# Patient Record
Sex: Female | Born: 1945 | Race: White | Hispanic: No | Marital: Married | Smoking: Former smoker
Health system: Southern US, Community
[De-identification: ages and names within clinical notes are randomized; demographics above are authoritative.]

## PROBLEM LIST (undated history)

## (undated) DIAGNOSIS — I2510 Atherosclerotic heart disease of native coronary artery without angina pectoris: Secondary | ICD-10-CM

## (undated) DIAGNOSIS — R251 Tremor, unspecified: Secondary | ICD-10-CM

## (undated) DIAGNOSIS — Z9889 Other specified postprocedural states: Secondary | ICD-10-CM

## (undated) DIAGNOSIS — I679 Cerebrovascular disease, unspecified: Secondary | ICD-10-CM

## (undated) DIAGNOSIS — J449 Chronic obstructive pulmonary disease, unspecified: Secondary | ICD-10-CM

## (undated) DIAGNOSIS — G8929 Other chronic pain: Secondary | ICD-10-CM

## (undated) DIAGNOSIS — I272 Pulmonary hypertension, unspecified: Secondary | ICD-10-CM

## (undated) DIAGNOSIS — Z87448 Personal history of other diseases of urinary system: Secondary | ICD-10-CM

## (undated) DIAGNOSIS — G5600 Carpal tunnel syndrome, unspecified upper limb: Secondary | ICD-10-CM

## (undated) DIAGNOSIS — R011 Cardiac murmur, unspecified: Secondary | ICD-10-CM

## (undated) DIAGNOSIS — R42 Dizziness and giddiness: Secondary | ICD-10-CM

## (undated) DIAGNOSIS — D649 Anemia, unspecified: Secondary | ICD-10-CM

## (undated) DIAGNOSIS — R0600 Dyspnea, unspecified: Secondary | ICD-10-CM

## (undated) DIAGNOSIS — F32A Depression, unspecified: Secondary | ICD-10-CM

## (undated) DIAGNOSIS — K219 Gastro-esophageal reflux disease without esophagitis: Secondary | ICD-10-CM

## (undated) DIAGNOSIS — I10 Essential (primary) hypertension: Secondary | ICD-10-CM

## (undated) DIAGNOSIS — J189 Pneumonia, unspecified organism: Secondary | ICD-10-CM

## (undated) DIAGNOSIS — E785 Hyperlipidemia, unspecified: Secondary | ICD-10-CM

## (undated) DIAGNOSIS — Z8679 Personal history of other diseases of the circulatory system: Secondary | ICD-10-CM

## (undated) DIAGNOSIS — M1990 Unspecified osteoarthritis, unspecified site: Secondary | ICD-10-CM

## (undated) DIAGNOSIS — M810 Age-related osteoporosis without current pathological fracture: Secondary | ICD-10-CM

## (undated) DIAGNOSIS — M549 Dorsalgia, unspecified: Secondary | ICD-10-CM

## (undated) DIAGNOSIS — F329 Major depressive disorder, single episode, unspecified: Secondary | ICD-10-CM

## (undated) DIAGNOSIS — H269 Unspecified cataract: Secondary | ICD-10-CM

## (undated) DIAGNOSIS — R112 Nausea with vomiting, unspecified: Secondary | ICD-10-CM

## (undated) DIAGNOSIS — G450 Vertebro-basilar artery syndrome: Secondary | ICD-10-CM

## (undated) DIAGNOSIS — R7303 Prediabetes: Secondary | ICD-10-CM

## (undated) HISTORY — PX: OTHER SURGICAL HISTORY: SHX169

## (undated) HISTORY — PX: WISDOM TOOTH EXTRACTION: SHX21

## (undated) HISTORY — PX: ABDOMINAL HYSTERECTOMY: SHX81

## (undated) HISTORY — DX: Tremor, unspecified: R25.1

## (undated) HISTORY — PX: TONSILLECTOMY: SUR1361

## (undated) HISTORY — PX: BLEPHAROPLASTY: SUR158

## (undated) HISTORY — DX: Other chronic pain: G89.29

## (undated) HISTORY — PX: CARDIAC CATHETERIZATION: SHX172

---

## 1999-02-18 ENCOUNTER — Ambulatory Visit (HOSPITAL_COMMUNITY): Admission: RE | Admit: 1999-02-18 | Discharge: 1999-02-18 | Payer: Self-pay | Admitting: Neurological Surgery

## 1999-02-18 ENCOUNTER — Encounter: Payer: Self-pay | Admitting: Neurological Surgery

## 1999-03-04 ENCOUNTER — Ambulatory Visit (HOSPITAL_COMMUNITY): Admission: RE | Admit: 1999-03-04 | Discharge: 1999-03-04 | Payer: Self-pay | Admitting: Neurological Surgery

## 1999-03-04 ENCOUNTER — Encounter: Payer: Self-pay | Admitting: Neurological Surgery

## 1999-03-26 ENCOUNTER — Encounter: Payer: Self-pay | Admitting: Neurological Surgery

## 1999-03-26 ENCOUNTER — Ambulatory Visit (HOSPITAL_COMMUNITY): Admission: RE | Admit: 1999-03-26 | Discharge: 1999-03-26 | Payer: Self-pay | Admitting: Neurological Surgery

## 1999-12-22 ENCOUNTER — Ambulatory Visit (HOSPITAL_COMMUNITY): Admission: RE | Admit: 1999-12-22 | Discharge: 1999-12-22 | Payer: Self-pay | Admitting: Neurological Surgery

## 1999-12-22 ENCOUNTER — Encounter: Payer: Self-pay | Admitting: Neurological Surgery

## 2000-01-24 ENCOUNTER — Encounter: Payer: Self-pay | Admitting: Neurological Surgery

## 2000-01-24 ENCOUNTER — Ambulatory Visit (HOSPITAL_COMMUNITY): Admission: RE | Admit: 2000-01-24 | Discharge: 2000-01-24 | Payer: Self-pay | Admitting: Neurological Surgery

## 2001-10-07 ENCOUNTER — Encounter: Admission: RE | Admit: 2001-10-07 | Discharge: 2001-10-07 | Payer: Self-pay | Admitting: Neurological Surgery

## 2001-10-07 ENCOUNTER — Encounter: Payer: Self-pay | Admitting: Neurological Surgery

## 2002-05-09 ENCOUNTER — Encounter: Payer: Self-pay | Admitting: Neurological Surgery

## 2002-05-09 ENCOUNTER — Encounter: Admission: RE | Admit: 2002-05-09 | Discharge: 2002-05-09 | Payer: Self-pay | Admitting: Neurological Surgery

## 2005-10-19 ENCOUNTER — Ambulatory Visit: Payer: Self-pay | Admitting: Internal Medicine

## 2005-11-03 ENCOUNTER — Ambulatory Visit: Payer: Self-pay | Admitting: Internal Medicine

## 2005-11-03 ENCOUNTER — Encounter (INDEPENDENT_AMBULATORY_CARE_PROVIDER_SITE_OTHER): Payer: Self-pay | Admitting: *Deleted

## 2005-11-06 ENCOUNTER — Ambulatory Visit: Payer: Self-pay | Admitting: Gastroenterology

## 2005-12-04 ENCOUNTER — Ambulatory Visit: Payer: Self-pay | Admitting: Gastroenterology

## 2006-01-01 ENCOUNTER — Ambulatory Visit: Payer: Self-pay | Admitting: Gastroenterology

## 2006-02-01 ENCOUNTER — Ambulatory Visit: Payer: Self-pay | Admitting: Gastroenterology

## 2006-07-16 ENCOUNTER — Ambulatory Visit: Payer: Self-pay | Admitting: Gastroenterology

## 2006-07-30 ENCOUNTER — Ambulatory Visit: Payer: Self-pay | Admitting: Gastroenterology

## 2006-08-30 ENCOUNTER — Ambulatory Visit: Payer: Self-pay | Admitting: Gastroenterology

## 2006-09-24 ENCOUNTER — Ambulatory Visit: Payer: Self-pay | Admitting: Gastroenterology

## 2006-10-22 ENCOUNTER — Ambulatory Visit: Payer: Self-pay | Admitting: Internal Medicine

## 2007-01-20 ENCOUNTER — Ambulatory Visit: Payer: Self-pay | Admitting: Gastroenterology

## 2007-04-22 ENCOUNTER — Ambulatory Visit: Payer: Self-pay | Admitting: Gastroenterology

## 2007-07-22 ENCOUNTER — Ambulatory Visit: Payer: Self-pay | Admitting: Gastroenterology

## 2007-10-27 ENCOUNTER — Ambulatory Visit: Payer: Self-pay | Admitting: Gastroenterology

## 2007-12-02 ENCOUNTER — Ambulatory Visit: Payer: Self-pay | Admitting: Gastroenterology

## 2007-12-09 ENCOUNTER — Ambulatory Visit: Payer: Self-pay | Admitting: Internal Medicine

## 2007-12-09 HISTORY — PX: COLONOSCOPY: SHX174

## 2007-12-16 ENCOUNTER — Ambulatory Visit: Payer: Self-pay | Admitting: Gastroenterology

## 2007-12-23 ENCOUNTER — Ambulatory Visit: Payer: Self-pay | Admitting: Gastroenterology

## 2007-12-29 ENCOUNTER — Ambulatory Visit: Payer: Self-pay | Admitting: Gastroenterology

## 2008-01-06 ENCOUNTER — Ambulatory Visit: Payer: Self-pay | Admitting: Gastroenterology

## 2008-01-19 ENCOUNTER — Ambulatory Visit: Payer: Self-pay | Admitting: Gastroenterology

## 2008-03-14 ENCOUNTER — Ambulatory Visit: Payer: Self-pay | Admitting: Gastroenterology

## 2009-04-07 ENCOUNTER — Emergency Department (HOSPITAL_BASED_OUTPATIENT_CLINIC_OR_DEPARTMENT_OTHER): Admission: EM | Admit: 2009-04-07 | Discharge: 2009-04-08 | Payer: Self-pay | Admitting: Emergency Medicine

## 2009-04-07 ENCOUNTER — Ambulatory Visit: Payer: Self-pay | Admitting: Diagnostic Radiology

## 2010-12-28 LAB — CBC
Hemoglobin: 12.6 g/dL (ref 12.0–15.0)
MCHC: 35 g/dL (ref 30.0–36.0)
RBC: 4.12 MIL/uL (ref 3.87–5.11)
WBC: 12.9 10*3/uL — ABNORMAL HIGH (ref 4.0–10.5)

## 2010-12-28 LAB — DIFFERENTIAL
Basophils Relative: 1 % (ref 0–1)
Eosinophils Absolute: 0 10*3/uL (ref 0.0–0.7)
Eosinophils Relative: 0 % (ref 0–5)
Lymphs Abs: 0.4 10*3/uL — ABNORMAL LOW (ref 0.7–4.0)
Monocytes Relative: 4 % (ref 3–12)

## 2010-12-28 LAB — COMPREHENSIVE METABOLIC PANEL
ALT: 18 U/L (ref 0–35)
AST: 33 U/L (ref 0–37)
Alkaline Phosphatase: 69 U/L (ref 39–117)
CO2: 25 mEq/L (ref 19–32)
Calcium: 9.1 mg/dL (ref 8.4–10.5)
GFR calc Af Amer: >60 mL/min (ref >60)
GFR calc non Af Amer: >60 mL/min (ref >60)
Glucose, Bld: 124 mg/dL — ABNORMAL HIGH (ref 70–99)
Potassium: 3.2 mEq/L — ABNORMAL LOW (ref 3.5–5.1)
Sodium: 138 mEq/L (ref 135–145)

## 2010-12-28 LAB — CULTURE, BLOOD (ROUTINE X 2)
Culture: NO GROWTH
Culture: NO GROWTH

## 2010-12-28 LAB — URINALYSIS, ROUTINE W REFLEX MICROSCOPIC
Glucose, UA: NEGATIVE mg/dL
Hgb urine dipstick: NEGATIVE
Ketones, ur: NEGATIVE mg/dL
Protein, ur: NEGATIVE mg/dL
Urobilinogen, UA: 0.2 mg/dL (ref 0.0–1.0)

## 2011-07-22 ENCOUNTER — Other Ambulatory Visit: Payer: Self-pay | Admitting: Otolaryngology

## 2011-07-22 DIAGNOSIS — R42 Dizziness and giddiness: Secondary | ICD-10-CM

## 2011-07-28 ENCOUNTER — Ambulatory Visit
Admission: RE | Admit: 2011-07-28 | Discharge: 2011-07-28 | Disposition: A | Discharge status: Home / Self Care | Payer: Medicare Other | Source: Ambulatory Visit | Attending: Otolaryngology | Admitting: Otolaryngology

## 2011-07-28 DIAGNOSIS — R42 Dizziness and giddiness: Secondary | ICD-10-CM

## 2011-07-28 MED ORDER — GADOBENATE DIMEGLUMINE 529 MG/ML IV SOLN
12.0000 mL | Freq: Once | INTRAVENOUS | Status: AC | PRN
  Administered 2011-07-28: 12 mL via INTRAVENOUS

## 2013-10-19 HISTORY — PX: AORTIC VALVE REPLACEMENT (AVR)/CORONARY ARTERY BYPASS GRAFTING (CABG): SHX5725

## 2014-09-27 ENCOUNTER — Encounter: Payer: Self-pay | Admitting: Gastroenterology

## 2014-09-27 ENCOUNTER — Encounter: Payer: Self-pay | Admitting: Internal Medicine

## 2015-03-18 HISTORY — PX: BACK SURGERY: SHX140

## 2015-11-26 ENCOUNTER — Encounter: Payer: Self-pay | Admitting: Internal Medicine

## 2016-06-25 HISTORY — PX: SACROSPINOUS LIGAMENT FIXATION: SHX2371

## 2017-05-16 ENCOUNTER — Emergency Department (HOSPITAL_BASED_OUTPATIENT_CLINIC_OR_DEPARTMENT_OTHER)
Admission: EM | Admit: 2017-05-16 | Discharge: 2017-05-16 | Disposition: A | Discharge status: Home / Self Care | Payer: Medicare Other | Attending: Emergency Medicine | Admitting: Emergency Medicine

## 2017-05-16 DIAGNOSIS — R112 Nausea with vomiting, unspecified: Secondary | ICD-10-CM | POA: Diagnosis not present

## 2017-05-16 DIAGNOSIS — R531 Weakness: Secondary | ICD-10-CM | POA: Insufficient documentation

## 2017-05-16 LAB — CBC WITH DIFFERENTIAL/PLATELET
BASOS ABS: 0 10*3/uL (ref 0.0–0.1)
Basophils Relative: 0 %
EOS ABS: 0.1 10*3/uL (ref 0.0–0.7)
EOS PCT: 1 %
HEMATOCRIT: 34.3 % — AB (ref 36.0–46.0)
Hemoglobin: 11.1 g/dL — ABNORMAL LOW (ref 12.0–15.0)
LYMPHS PCT: 8 %
Lymphs Abs: 0.8 10*3/uL (ref 0.7–4.0)
MCH: 29.4 pg (ref 26.0–34.0)
MCHC: 32.4 g/dL (ref 30.0–36.0)
MCV: 90.7 fL (ref 78.0–100.0)
Monocytes Absolute: 0.6 10*3/uL (ref 0.1–1.0)
Monocytes Relative: 6 %
NEUTROS ABS: 7.8 10*3/uL — AB (ref 1.7–7.7)
NEUTROS PCT: 85 %
Platelets: 461 10*3/uL — ABNORMAL HIGH (ref 150–400)
RBC: 3.78 MIL/uL — ABNORMAL LOW (ref 3.87–5.11)
RDW: 13.5 % (ref 11.5–15.5)
WBC: 9.2 10*3/uL (ref 4.0–10.5)

## 2017-05-16 LAB — COMPREHENSIVE METABOLIC PANEL
ALT: 18 U/L (ref 14–54)
ANION GAP: 14 (ref 5–15)
AST: 22 U/L (ref 15–41)
Albumin: 4 g/dL (ref 3.5–5.0)
Alkaline Phosphatase: 93 U/L (ref 38–126)
BILIRUBIN TOTAL: 0.9 mg/dL (ref 0.3–1.2)
BUN: 15 mg/dL (ref 6–20)
CHLORIDE: 101 mmol/L (ref 101–111)
CO2: 24 mmol/L (ref 22–32)
Calcium: 9.7 mg/dL (ref 8.9–10.3)
Creatinine, Ser: 0.75 mg/dL (ref 0.44–1.00)
Glucose, Bld: 117 mg/dL — ABNORMAL HIGH (ref 65–99)
POTASSIUM: 3.5 mmol/L (ref 3.5–5.1)
Sodium: 139 mmol/L (ref 135–145)
TOTAL PROTEIN: 7.7 g/dL (ref 6.5–8.1)

## 2017-05-16 LAB — URINALYSIS, ROUTINE W REFLEX MICROSCOPIC
Bilirubin Urine: NEGATIVE
Glucose, UA: NEGATIVE mg/dL
Hgb urine dipstick: NEGATIVE
Ketones, ur: NEGATIVE mg/dL
Leukocytes, UA: NEGATIVE
NITRITE: NEGATIVE
PH: 7.5 (ref 5.0–8.0)
Protein, ur: NEGATIVE mg/dL
SPECIFIC GRAVITY, URINE: 1.008 (ref 1.005–1.030)

## 2017-05-16 LAB — I-STAT CG4 LACTIC ACID, ED: Lactic Acid, Venous: 1.93 mmol/L — ABNORMAL HIGH (ref 0.5–1.9)

## 2017-05-16 LAB — CBG MONITORING, ED: GLUCOSE-CAPILLARY: 101 mg/dL — AB (ref 65–99)

## 2017-05-16 LAB — TROPONIN I: Troponin I: <0.03 ng/mL (ref <0.03)

## 2017-05-16 LAB — LIPASE, BLOOD: LIPASE: 24 U/L (ref 11–51)

## 2017-05-16 MED ORDER — METOCLOPRAMIDE HCL 5 MG/ML IJ SOLN
10.0000 mg | INTRAMUSCULAR | Status: AC
  Administered 2017-05-16: 10 mg via INTRAVENOUS
  Filled 2017-05-16: qty 2

## 2017-05-16 MED ORDER — FENTANYL CITRATE (PF) 100 MCG/2ML IJ SOLN
50.0000 ug | Freq: Once | INTRAMUSCULAR | Status: AC
  Administered 2017-05-16: 50 ug via INTRAVENOUS
  Filled 2017-05-16: qty 2

## 2017-05-16 MED ORDER — ONDANSETRON 4 MG PO TBDP: 4.0000 mg | ORAL_TABLET | Freq: Three times a day (TID) | ORAL | 0 refills | Status: DC | PRN

## 2017-05-16 MED ORDER — SODIUM CHLORIDE 0.9 % IV BOLUS (SEPSIS)
1000.0000 mL | Freq: Once | INTRAVENOUS | Status: AC
  Administered 2017-05-16: 1000 mL via INTRAVENOUS

## 2017-05-16 NOTE — ED Notes (Signed)
Pt states that she is feeling better.  Repositioned for comfort.  Family remains at the bedside

## 2017-05-16 NOTE — ED Notes (Signed)
Pt on cardiac monitor and auto VS 

## 2017-05-16 NOTE — ED Notes (Signed)
Gave pt coke and saltine crackers.  Pt sat up at the edge of the bed and was able to tolerate the coke and crackers.  No acute distress at this time.  Will update EDP

## 2017-05-16 NOTE — ED Notes (Signed)
ED Provider at bedside. 

## 2017-05-16 NOTE — ED Triage Notes (Signed)
N/v x 2 days, recent back surgery x 2 weeks ago. Lethargic

## 2017-05-16 NOTE — ED Notes (Signed)
Pt notified that urine is needed. Pt reports that she voided before coming to the ED and states that she can not void.  Will check back with her shortly after a bit more IV fluids have infused.

## 2017-05-16 NOTE — ED Provider Notes (Signed)
[facility]   CSN: [id number] Arrival date & time: 05/16/17  1529     History   Chief Complaint Chief Complaint  Patient presents with  . Weakness    HPI Karen Woods is a 71 y.o. female.  71yo F w/ extensive PMH including COPD, HTN, HLD, T2DM, aortic valve repair, recent spine surgery who p/w nausea, vomiting, and weakness. The patient is 2 weeks status post spine surgery at [facility]. She has been recovering at home. Daughter states that 2 days ago she was weaned off of OxyContin. Around 2 days ago she began having nausea and vomiting which has persisted despite trying Zofran. She has had decreased oral intake. She has had sweats but no fevers. She had a normal bowel movement today, no blood and no diarrhea. She complains of some mild left side pain but denies any severe abdominal pain currently. Currently, her back pain is 9/10 in intensity. She denies any chest pain, SOB, urinary sx or sick contacts. She has healed well and no drainage or redness at incision site. She has felt lightheaded at home but no syncopal episodes. When she got to the ER she had a near syncopal episode while sitting in the wheelchair coming back.   The history is provided by the patient and a relative.  Weakness     No past medical history on file.  There are no active problems to display for this patient.   No past surgical history on file.  OB History    No data available       Home Medications    Prior to Admission medications   Medication Sig Start Date End Date Taking? Authorizing Provider  ondansetron (ZOFRAN ODT) 4 MG disintegrating tablet Take 1 tablet (4 mg total) by mouth every 8 (eight) hours as needed for nausea or vomiting. 05/16/17   Jerret Mcbane, Ambrose Finland, MD    Family History No family history on file.  Social History Social History  Substance Use Topics  . Smoking status: Not on file  . Smokeless tobacco: Not on file  . Alcohol use  Not on file     Allergies   Codeine   Review of Systems Review of Systems  Neurological: Positive for weakness.   All other systems reviewed and are negative except that which was mentioned in HPI   Physical Exam Updated Vital Signs BP (!) 146/75   Pulse (!) 52   Temp (!) 97.5 F (36.4 C) (Oral)   Resp 12   Ht 5\' 4"  (1.626 m)   Wt 68 kg (150 lb)   SpO2 99%   BMI 25.75 kg/m   Physical Exam  Constitutional: She is oriented to person, place, and time. She appears well-developed and well-nourished. She appears distressed.  Sleepy, pale, weak  HENT:  Head: Normocephalic and atraumatic.  dry mucous membranes  Eyes: Pupils are equal, round, and reactive to light. Conjunctivae are normal.  Neck: Neck supple.  Cardiovascular: Normal rate, regular rhythm and normal heart sounds.   No murmur heard. Pulmonary/Chest: Effort normal and breath sounds normal.  Abdominal: Soft. Bowel sounds are normal. She exhibits no distension. There is no tenderness.  Musculoskeletal: She exhibits no edema.  Neurological: She is alert and oriented to person, place, and time.  Fluent speech, moving all 4 extremities equally  Skin: Skin is warm and dry. There is pallor.  Large surgical incision along length of thoracolumbar spine to top of gluteal crease, clean/dry/intact without surrounding redness  Psychiatric:  In mild distress  Nursing note and vitals reviewed.    ED Treatments / Results  Labs (all labs ordered are listed, but only abnormal results are displayed) Labs Reviewed  COMPREHENSIVE METABOLIC PANEL - Abnormal; Notable for the following:       Result Value   Glucose, Bld 117 (*)    All other components within normal limits  CBC WITH DIFFERENTIAL/PLATELET - Abnormal; Notable for the following:    RBC 3.78 (*)    Hemoglobin 11.1 (*)    HCT 34.3 (*)    Platelets 461 (*)    Neutro Abs 7.8 (*)    All other components within normal limits  URINALYSIS, ROUTINE W REFLEX  MICROSCOPIC - Abnormal; Notable for the following:    APPearance CLOUDY (*)    All other components within normal limits  CBG MONITORING, ED - Abnormal; Notable for the following:    Glucose-Capillary 101 (*)    All other components within normal limits  I-STAT CG4 LACTIC ACID, ED - Abnormal; Notable for the following:    Lactic Acid, Venous 1.93 (*)    All other components within normal limits  LIPASE, BLOOD  TROPONIN I    EKG  EKG Interpretation  Date/Time:  Sunday May 16 2017 15:39:59 EDT Ventricular Rate:  62 PR Interval:    QRS Duration: 98 QT Interval:  443 QTC Calculation: 450 R Axis:   79 Text Interpretation:  Sinus rhythm Ventricular premature complex Borderline ST depression, diffuse leads Baseline wander in lead(s) II III aVR aVL aVF V1 V2 V3 V4 V5 V6 No previous ECGs available Confirmed by Frederick Peers [phone]) on 05/16/2017 3:57:42 PM Also confirmed by Frederick Peers [phone]), editor Madalyn Rob [phone])  on 05/16/2017 4:07:41 PM       Radiology No results found.  Procedures Procedures (including critical care time)  Medications Ordered in ED Medications  sodium chloride 0.9 % bolus 1,000 mL (0 mLs Intravenous Stopped 05/16/17 1730)  metoCLOPramide (REGLAN) injection 10 mg (10 mg Intravenous Given 05/16/17 1610)  fentaNYL (SUBLIMAZE) injection 50 mcg (50 mcg Intravenous Given 05/16/17 1729)     Initial Impression / Assessment and Plan / ED Course  I have reviewed the triage vital signs and the nursing notes.  Pertinent labs & imaging results that were available during my care of the patient were reviewed by me and considered in my medical decision making (see chart for details).     PT w/ 2 days of N/V, 2 weeks s/p spine surgery. She was immediately Brought back from triage when she had a near syncopal episode in the wheelchair. She was pale and in mild distress but able to answer questions, vital signs stable. Afebrile. No abdominal tenderness.  Incision site appears to be healing well with no evidence of infection. EKG without acute ischemia or arrhythmia. Gave IV fluids, Reglan.   Obtained above labs, initial lactate 1.93, Wbc normal, Hgb 11.1 which is similar to previous and appropriate after her surgery, normal CMP and lipase, no evidence of UTI, normal Cr. Trop negative and pt has denied any epigastric pain, CP, breathing problems or other complaints that suggest cardiac etiology. No abdominal pain and no tenderness on exam therefore I do not feel she needs abdominal imaging. No headache or neuro sx to suggest intracranial cause of vomiting. She felt much better on re-examination, was sitting up and well appearing. She has been able to walk a short distance in ED. She wants to go home. I have discussed  supportive measures, given zofran to use as needed, and extensively reviewed return precautions with her and her family. Pt discharged in satisfactory condition. Final Clinical Impressions(s) / ED Diagnoses   Final diagnoses:  Weakness  Non-intractable vomiting with nausea, unspecified vomiting type    New Prescriptions Discharge Medication List as of 05/16/2017  7:18 PM    START taking these medications   Details  ondansetron (ZOFRAN ODT) 4 MG disintegrating tablet Take 1 tablet (4 mg total) by mouth every 8 (eight) hours as needed for nausea or vomiting., Starting Sun 05/16/2017, Print         Ovila Lepage, Ambrose Finland, MD 05/17/17 [phone]

## 2018-01-06 ENCOUNTER — Encounter: Payer: Self-pay | Admitting: Internal Medicine

## 2018-03-09 ENCOUNTER — Other Ambulatory Visit: Payer: Self-pay

## 2018-03-09 ENCOUNTER — Emergency Department (HOSPITAL_BASED_OUTPATIENT_CLINIC_OR_DEPARTMENT_OTHER)
Admission: EM | Admit: 2018-03-09 | Discharge: 2018-03-09 | Disposition: A | Discharge status: Home / Self Care | Payer: Medicare Other | Attending: Emergency Medicine | Admitting: Emergency Medicine

## 2018-03-09 ENCOUNTER — Encounter (HOSPITAL_BASED_OUTPATIENT_CLINIC_OR_DEPARTMENT_OTHER): Payer: Self-pay | Admitting: *Deleted

## 2018-03-09 DIAGNOSIS — Z5321 Procedure and treatment not carried out due to patient leaving prior to being seen by health care provider: Secondary | ICD-10-CM | POA: Insufficient documentation

## 2018-03-09 DIAGNOSIS — R21 Rash and other nonspecific skin eruption: Secondary | ICD-10-CM | POA: Insufficient documentation

## 2018-03-09 HISTORY — DX: Atherosclerotic heart disease of native coronary artery without angina pectoris: I25.10

## 2018-03-09 NOTE — ED Notes (Signed)
Pt states her rash has gone , states she is going home

## 2018-03-09 NOTE — ED Triage Notes (Addendum)
Pt c/o rash to chest and arms x 1 day PTA benadryl and zyrtec

## 2018-06-15 ENCOUNTER — Other Ambulatory Visit: Payer: Self-pay | Admitting: Rehabilitation

## 2018-06-15 ENCOUNTER — Inpatient Hospital Stay
Admission: RE | Admit: 2018-06-15 | Discharge: 2018-06-15 | Disposition: A | Discharge status: Home / Self Care | Payer: Medicare Other | Source: Ambulatory Visit | Attending: Rehabilitation | Admitting: Rehabilitation

## 2018-06-15 DIAGNOSIS — M5104 Intervertebral disc disorders with myelopathy, thoracic region: Secondary | ICD-10-CM

## 2018-09-05 NOTE — Progress Notes (Signed)
Please place orders in Epic as patient is being scheduled for a pre-op appointment! Thank you! 

## 2018-09-27 ENCOUNTER — Encounter (HOSPITAL_COMMUNITY): Payer: Self-pay

## 2018-09-27 NOTE — Patient Instructions (Addendum)
Your procedure is scheduled on: Monday, Jan. 13, 2020   Surgery Time: 10:35AM-12:15PM   Report to [facility] Main  Entrance    Report to admitting at 8:05 AM   Call this number if you have problems the morning of surgery [phone]   Do not eat food or drink liquids :After Midnight.   Brush your teeth the morning of surgery.   Do NOT smoke after Midnight   Take these medicines the morning of surgery with A SIP OF WATER: Gabapentin, Metoprolol, Omeprazole, Rosuvastatin   Bring Asthma Inhaler day of surgery                               You may not have any metal on your body including hair pins, jewelry, and body piercings             Do not wear make-up, lotions, powders, perfumes/cologne, or deodorant             Do not wear nail polish.  Do not shave  48 hours prior to surgery.             Do not bring valuables to the hospital. [place] [facility] IS NOT             RESPONSIBLE   FOR VALUABLES.   Contacts, dentures or bridgework may not be worn into surgery.   Leave suitcase in the car. After surgery it may be brought to your room.    Special Instructions: Bring a copy of your healthcare power of attorney and living will documents         the day of surgery if you haven't scanned them in before.              Please read over the following fact sheets you were given:  [facility] - Preparing for Surgery Before surgery, you can play an important role.  Because skin is not sterile, your skin needs to be as free of germs as possible.  You can reduce the number of germs on your skin by washing with CHG (chlorahexidine gluconate) soap before surgery.  CHG is an antiseptic cleaner which kills germs and bonds with the skin to continue killing germs even after washing. Please DO NOT use if you have an allergy to CHG or antibacterial soaps.  If your skin becomes reddened/irritated stop using the CHG and inform your nurse when you arrive at Short Stay. Do not shave (including  legs and underarms) for at least 48 hours prior to the first CHG shower.  You may shave your face/neck.  Please follow these instructions carefully:  1.  Shower with CHG Soap the night before surgery and the  morning of surgery.  2.  If you choose to wash your hair, wash your hair first as usual with your normal  shampoo.  3.  After you shampoo, rinse your hair and body thoroughly to remove the shampoo.                             4.  Use CHG as you would any other liquid soap.  You can apply chg directly to the skin and wash.  Gently with a scrungie or clean washcloth.  5.  Apply the CHG Soap to your body ONLY FROM THE NECK DOWN.   Do   not use on face/ open  Wound or open sores. Avoid contact with eyes, ears mouth and   genitals (private parts).                       Wash face,  Genitals (private parts) with your normal soap.             6.  Wash thoroughly, paying special attention to the area where your    surgery  will be performed.  7.  Thoroughly rinse your body with warm water from the neck down.  8.  DO NOT shower/wash with your normal soap after using and rinsing off the CHG Soap.                9.  Pat yourself dry with a clean towel.            10.  Wear clean pajamas.            11.  Place clean sheets on your bed the night of your first shower and do not  sleep with pets. Day of Surgery : Do not apply any lotions/deodorants the morning of surgery.  Please wear clean clothes to the hospital/surgery center.  FAILURE TO FOLLOW THESE INSTRUCTIONS MAY RESULT IN THE CANCELLATION OF YOUR SURGERY  PATIENT SIGNATURE_________________________________  NURSE SIGNATURE__________________________________  ________________________________________________________________________   Karen Woods  An incentive spirometer is a tool that can help keep your lungs clear and active. This tool measures how well you are filling your lungs with each breath. Taking  long deep breaths may help reverse or decrease the chance of developing breathing (pulmonary) problems (especially infection) following:  A long period of time when you are unable to move or be active. BEFORE THE PROCEDURE   If the spirometer includes an indicator to show your best effort, your nurse or respiratory therapist will set it to a desired goal.  If possible, sit up straight or lean slightly forward. Try not to slouch.  Hold the incentive spirometer in an upright position. INSTRUCTIONS FOR USE  1. Sit on the edge of your bed if possible, or sit up as far as you can in bed or on a chair. 2. Hold the incentive spirometer in an upright position. 3. Breathe out normally. 4. Place the mouthpiece in your mouth and seal your lips tightly around it. 5. Breathe in slowly and as deeply as possible, raising the piston or the ball toward the top of the column. 6. Hold your breath for 3-5 seconds or for as long as possible. Allow the piston or ball to fall to the bottom of the column. 7. Remove the mouthpiece from your mouth and breathe out normally. 8. Rest for a few seconds and repeat Steps 1 through 7 at least 10 times every 1-2 hours when you are awake. Take your time and take a few normal breaths between deep breaths. 9. The spirometer may include an indicator to show your best effort. Use the indicator as a goal to work toward during each repetition. 10. After each set of 10 deep breaths, practice coughing to be sure your lungs are clear. If you have an incision (the cut made at the time of surgery), support your incision when coughing by placing a pillow or rolled up towels firmly against it. Once you are able to get out of bed, walk around indoors and cough well. You may stop using the incentive spirometer when instructed by your caregiver.  RISKS AND COMPLICATIONS  Take your time  so you do not get dizzy or light-headed.  If you are in pain, you may need to take or ask for pain  medication before doing incentive spirometry. It is harder to take a deep breath if you are having pain. AFTER USE  Rest and breathe slowly and easily.  It can be helpful to keep track of a log of your progress. Your caregiver can provide you with a simple table to help with this. If you are using the spirometer at home, follow these instructions: Karen Woods IF:   You are having difficultly using the spirometer.  You have trouble using the spirometer as often as instructed.  Your pain medication is not giving enough relief while using the spirometer.  You develop fever of 100.5 F (38.1 C) or higher. SEEK IMMEDIATE MEDICAL CARE IF:   You cough up bloody sputum that had not been present before.  You develop fever of 102 F (38.9 C) or greater.  You develop worsening pain at or near the incision site. MAKE SURE YOU:   Understand these instructions.  Will watch your condition.  Will get help right away if you are not doing well or get worse. Document Released: 01/18/2007 Document Revised: 11/30/2011 Document Reviewed: 03/21/2007 Exit[employer] Patient Information 2014 Exit[employer], [place].   ________________________________________________________________________  WHAT IS A BLOOD TRANSFUSION? Blood Transfusion Information  A transfusion is the replacement of blood or some of its parts. Blood is made up of multiple cells which provide different functions.  Red blood cells carry oxygen and are used for blood loss replacement.  White blood cells fight against infection.  Platelets control bleeding.  Plasma helps clot blood.  Other blood products are available for specialized needs, such as hemophilia or other clotting disorders. BEFORE THE TRANSFUSION  Who gives blood for transfusions?   Healthy volunteers who are fully evaluated to make sure their blood is safe. This is blood bank blood. Transfusion therapy is the safest it has ever been in the practice of medicine.  Before blood is taken from a donor, a complete history is taken to make sure that person has no history of diseases nor engages in risky social behavior (examples are intravenous drug use or sexual activity with multiple partners). The donor's travel history is screened to minimize risk of transmitting infections, such as malaria. The donated blood is tested for signs of infectious diseases, such as HIV and hepatitis. The blood is then tested to be sure it is compatible with you in order to minimize the chance of a transfusion reaction. If you or a relative donates blood, this is often done in anticipation of surgery and is not appropriate for emergency situations. It takes many days to process the donated blood. RISKS AND COMPLICATIONS Although transfusion therapy is very safe and saves many lives, the main dangers of transfusion include:   Getting an infectious disease.  Developing a transfusion reaction. This is an allergic reaction to something in the blood you were given. Every precaution is taken to prevent this. The decision to have a blood transfusion has been considered carefully by your caregiver before blood is given. Blood is not given unless the benefits outweigh the risks. AFTER THE TRANSFUSION  Right after receiving a blood transfusion, you will usually feel much better and more energetic. This is especially true if your red blood cells have gotten low (anemic). The transfusion raises the level of the red blood cells which carry oxygen, and this usually causes an energy increase.  The  nurse administering the transfusion will monitor you carefully for complications. HOME CARE INSTRUCTIONS  No special instructions are needed after a transfusion. You may find your energy is better. Speak with your caregiver about any limitations on activity for underlying diseases you may have. SEEK MEDICAL CARE IF:   Your condition is not improving after your transfusion.  You develop redness or  irritation at the intravenous (IV) site. SEEK IMMEDIATE MEDICAL CARE IF:  Any of the following symptoms occur over the next 12 hours:  Shaking chills.  You have a temperature by mouth above 102 F (38.9 C), not controlled by medicine.  Chest, back, or muscle pain.  People around you feel you are not acting correctly or are confused.  Shortness of breath or difficulty breathing.  Dizziness and fainting.  You get a rash or develop hives.  You have a decrease in urine output.  Your urine turns a dark color or changes to pink, red, or brown. Any of the following symptoms occur over the next 10 days:  You have a temperature by mouth above 102 F (38.9 C), not controlled by medicine.  Shortness of breath.  Weakness after normal activity.  The white part of the eye turns yellow (jaundice).  You have a decrease in the amount of urine or are urinating less often.  Your urine turns a dark color or changes to pink, red, or brown. Document Released: 09/04/2000 Document Revised: 11/30/2011 Document Reviewed: 04/23/2008 [facility] Patient Information 2014 [place], [place].  _______________________________________________________________________

## 2018-09-27 NOTE — H&P (Signed)
TOTAL HIP ADMISSION H&P  Patient is admitted for left total hip arthroplasty.  Subjective:  Chief Complaint: left hip pain  HPI: Karen Woods, 73 y.o. female, has a history of pain and functional disability in the left hip(s) due to arthritis and patient has failed non-surgical conservative treatments for greater than 12 weeks to include corticosteriod injections and activity modification.  Onset of symptoms was abrupt starting 1 years ago with gradually worsening course since that time.The patient noted no past surgery on the left hip(s).  Patient currently rates pain in the left hip at 10 out of 10 with activity. Patient has worsening of pain with activity and weight bearing, pain that interfers with activities of daily living and instability. Patient has evidence of bone-on-bone osteoarthritis by imaging studies. This condition presents safety issues increasing the risk of falls. There is no current active infection.  There are no active problems to display for this patient.  Past Medical History:  Diagnosis Date  . Coronary artery disease     Past Surgical History:  Procedure Laterality Date  . ABDOMINAL HYSTERECTOMY    . AORTIC VALVE REPLACEMENT (AVR)/CORONARY ARTERY BYPASS GRAFTING (CABG)    . BACK SURGERY      No current facility-administered medications for this encounter.    Current Outpatient Medications  Medication Sig Dispense Refill Last Dose  . albuterol (PROVENTIL) (5 MG/ML) 0.5% nebulizer solution Take 2.5 mg by nebulization every 6 (six) hours as needed for wheezing or shortness of breath.     Marland Kitchen aspirin EC 81 MG tablet Take 81 mg by mouth daily.     Marland Kitchen estradiol (ESTRACE) 0.5 MG tablet Take 0.5 mg by mouth daily.     . fluticasone furoate-vilanterol (BREO ELLIPTA) 100-25 MCG/INH AEPB Inhale 1 puff into the lungs daily as needed (asthma).     . gabapentin (NEURONTIN) 300 MG capsule Take 600-900 mg by mouth See admin instructions. Pt to take 600 mg twice daily, and 900 mg  at bedtime     . Glucosamine-Chondroit-Vit C-Mn (GLUCOSAMINE CHONDR 1500 COMPLX PO) Take 1 tablet by mouth daily.     Marland Kitchen HYDROcodone-acetaminophen (NORCO/VICODIN) 5-325 MG tablet Take 1 tablet by mouth every 6 (six) hours as needed for moderate pain.     . metoprolol tartrate (LOPRESSOR) 25 MG tablet Take 25 mg by mouth 2 (two) times daily.     . Multiple Vitamins-Minerals (MULTIVITAMIN WITH MINERALS) tablet Take 1 tablet by mouth daily.     Marland Kitchen omeprazole (PRILOSEC) 40 MG capsule Take 40 mg by mouth daily.     . rosuvastatin (CRESTOR) 40 MG tablet Take 40 mg by mouth daily.      Allergies  Allergen Reactions  . Codeine Rash    Social History   Tobacco Use  . Smoking status: Former Smoker  Substance Use Topics  . Alcohol use: Not on file    No family history on file.   Review of Systems  Constitutional: Negative for chills and fever.  HENT: Negative for congestion, sore throat and tinnitus.   Eyes: Negative for double vision, photophobia and pain.  Respiratory: Negative for cough, shortness of breath and wheezing.   Cardiovascular: Negative for chest pain, palpitations and orthopnea.  Gastrointestinal: Negative for heartburn, nausea and vomiting.  Genitourinary: Negative for dysuria, frequency and urgency.  Musculoskeletal: Positive for joint pain.  Neurological: Negative for dizziness, weakness and headaches.    Objective:  Physical Exam  Well nourished and well developed.  General: Alert and oriented x3, cooperative  and pleasant, no acute distress.  Head: normocephalic, atraumatic, neck supple.  Eyes: EOMI.  Respiratory: breath sounds clear in all fields, no wheezing, rales, or rhonchi. Cardiovascular: Regular rate and rhythm, no murmurs, gallops or rubs.  Abdomen: non-tender to palpation and soft, normoactive bowel sounds. Musculoskeletal: Left Hip Exam: ROM: Flexion to 100, No Internal Rotation, No External Rotation, and abduction 20 without discomfort. There is no  tenderness over the greater trochanter bursa.  Calves soft and nontender. Motor function intact in LE. Strength 5/5 LE bilaterally. Neuro: Distal pulses 2+. Sensation to light touch intact in LE.   Labs:   Estimated body mass index is 26.26 kg/m as calculated from the following:   Height as of 03/09/18: 5\' 4"  (1.626 m).   Weight as of 03/09/18: 69.4 kg.   Imaging Review Plain radiographs demonstrate severe degenerative joint disease of the left hip(s). The bone quality appears to be adequate for age and reported activity level.    Preoperative templating of the joint replacement has been completed, documented, and submitted to the Operating Room personnel in order to optimize intra-operative equipment management.     Assessment/Plan:  End stage arthritis, left hip(s)  The patient history, physical examination, clinical judgement of the provider and imaging studies are consistent with end stage degenerative joint disease of the left hip(s) and total hip arthroplasty is deemed medically necessary. The treatment options including medical management, injection therapy, arthroscopy and arthroplasty were discussed at length. The risks and benefits of total hip arthroplasty were presented and reviewed. The risks due to aseptic loosening, infection, stiffness, dislocation/subluxation,  thromboembolic complications and other imponderables were discussed.  The patient acknowledged the explanation, agreed to proceed with the plan and consent was signed. Patient is being admitted for inpatient treatment for surgery, pain control, PT, OT, prophylactic antibiotics, VTE prophylaxis, progressive ambulation and ADL's and discharge planning.The patient is planning to be discharged home.   Therapy Plans: HEP Disposition: Home with husband Planned DVT Prophylaxis: Aspirin 325 mg BID DME needed: None PCP: Susa Loffler, MD Cardiologist: Merrily Pew, MD TXA: IV Allergies: Codeine (hives) Anesthesia  Concerns: None BMI: 26.3  - Patient was instructed on what medications to stop prior to surgery. - Follow-up visit in 2 weeks with Dr. Lequita Halt - Begin physical therapy following surgery - Pre-operative lab work as pre-surgical testing - Prescriptions will be provided in hospital at time of discharge  Arther Abbott, PA-C Orthopedic Surgery [facility] [place]

## 2018-09-27 NOTE — Pre-Procedure Instructions (Signed)
Cardiac clearance from Dr. Laddie Aquas 08/31/2018 in chart.

## 2018-09-28 ENCOUNTER — Encounter (HOSPITAL_COMMUNITY): Payer: Self-pay

## 2018-09-28 ENCOUNTER — Encounter (HOSPITAL_COMMUNITY)
Admission: RE | Admit: 2018-09-28 | Discharge: 2018-09-28 | Disposition: A | Discharge status: Home / Self Care | Payer: Medicare Other | Source: Ambulatory Visit | Attending: Orthopedic Surgery | Admitting: Orthopedic Surgery

## 2018-09-28 ENCOUNTER — Other Ambulatory Visit: Payer: Self-pay

## 2018-09-28 DIAGNOSIS — I10 Essential (primary) hypertension: Secondary | ICD-10-CM | POA: Insufficient documentation

## 2018-09-28 DIAGNOSIS — Z01812 Encounter for preprocedural laboratory examination: Secondary | ICD-10-CM | POA: Diagnosis present

## 2018-09-28 DIAGNOSIS — M1612 Unilateral primary osteoarthritis, left hip: Secondary | ICD-10-CM | POA: Insufficient documentation

## 2018-09-28 HISTORY — DX: Prediabetes: R73.03

## 2018-09-28 HISTORY — DX: Cerebrovascular disease, unspecified: I67.9

## 2018-09-28 HISTORY — DX: Gastro-esophageal reflux disease without esophagitis: K21.9

## 2018-09-28 HISTORY — DX: Dizziness and giddiness: R42

## 2018-09-28 HISTORY — DX: Unspecified osteoarthritis, unspecified site: M19.90

## 2018-09-28 HISTORY — DX: Vertebro-basilar artery syndrome: G45.0

## 2018-09-28 HISTORY — DX: Hyperlipidemia, unspecified: E78.5

## 2018-09-28 HISTORY — DX: Personal history of other diseases of urinary system: Z87.448

## 2018-09-28 HISTORY — DX: Nausea with vomiting, unspecified: R11.2

## 2018-09-28 HISTORY — DX: Pneumonia, unspecified organism: J18.9

## 2018-09-28 HISTORY — DX: Cardiac murmur, unspecified: R01.1

## 2018-09-28 HISTORY — DX: Dorsalgia, unspecified: M54.9

## 2018-09-28 HISTORY — DX: Age-related osteoporosis without current pathological fracture: M81.0

## 2018-09-28 HISTORY — DX: Personal history of other diseases of the circulatory system: Z86.79

## 2018-09-28 HISTORY — DX: Depression, unspecified: F32.A

## 2018-09-28 HISTORY — DX: Major depressive disorder, single episode, unspecified: F32.9

## 2018-09-28 HISTORY — DX: Essential (primary) hypertension: I10

## 2018-09-28 HISTORY — DX: Nausea with vomiting, unspecified: Z98.890

## 2018-09-28 HISTORY — DX: Chronic obstructive pulmonary disease, unspecified: J44.9

## 2018-09-28 LAB — COMPREHENSIVE METABOLIC PANEL
ALT: 16 U/L (ref 0–44)
AST: 19 U/L (ref 15–41)
Albumin: 4.2 g/dL (ref 3.5–5.0)
Alkaline Phosphatase: 67 U/L (ref 38–126)
Anion gap: 9 (ref 5–15)
BUN: 14 mg/dL (ref 8–23)
CO2: 26 mmol/L (ref 22–32)
Calcium: 9.2 mg/dL (ref 8.9–10.3)
Chloride: 107 mmol/L (ref 98–111)
Creatinine, Ser: 0.67 mg/dL (ref 0.44–1.00)
GFR calc Af Amer: >60 mL/min (ref >60)
GFR calc non Af Amer: >60 mL/min (ref >60)
Glucose, Bld: 115 mg/dL — ABNORMAL HIGH (ref 70–99)
Potassium: 4 mmol/L (ref 3.5–5.1)
Sodium: 142 mmol/L (ref 135–145)
Total Bilirubin: 1.1 mg/dL (ref 0.3–1.2)
Total Protein: 7.3 g/dL (ref 6.5–8.1)

## 2018-09-28 LAB — CBC
HCT: 38.5 % (ref 36.0–46.0)
Hemoglobin: 11.7 g/dL — ABNORMAL LOW (ref 12.0–15.0)
MCH: 28.1 pg (ref 26.0–34.0)
MCHC: 30.4 g/dL (ref 30.0–36.0)
MCV: 92.3 fL (ref 80.0–100.0)
PLATELETS: 305 10*3/uL (ref 150–400)
RBC: 4.17 MIL/uL (ref 3.87–5.11)
RDW: 14.3 % (ref 11.5–15.5)
WBC: 7.7 10*3/uL (ref 4.0–10.5)
nRBC: 0 % (ref 0.0–0.2)

## 2018-09-28 LAB — HEMOGLOBIN A1C
HEMOGLOBIN A1C: 6 % — AB (ref 4.8–5.6)
Mean Plasma Glucose: 125.5 mg/dL

## 2018-09-28 LAB — PROTIME-INR
INR: 0.95
Prothrombin Time: 12.6 seconds (ref 11.4–15.2)

## 2018-09-28 LAB — APTT: aPTT: 29 seconds (ref 24–36)

## 2018-09-28 LAB — ABO/RH: ABO/RH(D): A POS

## 2018-09-28 LAB — SURGICAL PCR SCREEN
MRSA, PCR: NEGATIVE
Staphylococcus aureus: NEGATIVE

## 2018-09-28 NOTE — Pre-Procedure Instructions (Signed)
Received 02/06/2018 EKG from [facility] place in chart.

## 2018-09-28 NOTE — Pre-Procedure Instructions (Signed)
CBC, CMP, Hgb A1C results 09/28/2018 sent to Dr. Lequita Halt via epic.

## 2018-09-30 NOTE — Anesthesia Preprocedure Evaluation (Addendum)
Anesthesia Evaluation  Patient identified by MRN, date of birth, ID band Patient awake    Reviewed: Allergy & Precautions, NPO status , Patient's Chart, lab work & pertinent test results  History of Anesthesia Complications (+) PONV  Airway Mallampati: II  TM Distance: >3 FB     Dental   Pulmonary pneumonia, COPD, former smoker,    breath sounds clear to auscultation       Cardiovascular hypertension, + CAD  + Valvular Problems/Murmurs AS  Rhythm:Regular Rate:Normal  History noted.  CG   Neuro/Psych    GI/Hepatic Neg liver ROS, GERD  ,  Endo/Other  negative endocrine ROS  Renal/GU negative Renal ROS     Musculoskeletal   Abdominal   Peds  Hematology   Anesthesia Other Findings   Reproductive/Obstetrics                           Anesthesia Physical Anesthesia Plan  ASA: III  Anesthesia Plan: General   Post-op Pain Management:    Induction: Intravenous  PONV Risk Score and Plan: Ondansetron, Dexamethasone, Midazolam and Treatment may vary due to age or medical condition  Airway Management Planned: Oral ETT  Additional Equipment:   Intra-op Plan:   Post-operative Plan: Extubation in OR  Informed Consent: I have reviewed the patients History and Physical, chart, labs and discussed the procedure including the risks, benefits and alternatives for the proposed anesthesia with the patient or authorized representative who has indicated his/her understanding and acceptance.   Dental advisory given  Plan Discussed with: CRNA and Anesthesiologist  Anesthesia Plan Comments: (See PST note 09/28/18, Jodell Cipro, PA-C)      Anesthesia Quick Evaluation

## 2018-09-30 NOTE — Progress Notes (Signed)
Anesthesia Chart Review   Case:  [id number] Date/Time:  10/03/18 1020   Procedure:  TOTAL HIP ARTHROPLASTY ANTERIOR APPROACH (Left ) - 100min   Anesthesia type:  Choice   Pre-op diagnosis:  left hip osteoarthritis   Location:  [facility] ROOM 09 / [facility]   Surgeon:  Ollen GrossAluisio, Frank, MD      DISCUSSION: 73 yo former smoker with h/o PONV, GERD, severe aortic stenosis s/ bioprosthetic aortic valve replacement 2015, HTN, pre-diabetes (09/28/18 Glucose 115 A1C 6.0), depression, CAD, HLD, COPD, vertigo scheduled for above surgery on 10/03/2018 with Dr. Ollen GrossFrank Aluisio.   Pt is s/p AVR 10/19/2013 by Dr. Donette LarryNeal D Kon, bioprosthetic valve. She was last seen by Cardiology 08/03/2018 by Dr. Derrek Guarryl Alfred Kalil.  At this visit pt was asymptomatic, activity only limited by back pain and left hip pain.  HTN well controlled, CAD with no obstruction seen on cardiac cath, HLD managed with statin, normal function of AVR as seen on Echo 03/18/18.  Cardiac clearance received from Dr. Bary CastillaKalil on 08/31/18 and states pt is low risk for surgical procedure (on chart).   COPD followed by pulmonology.  She is not on supplemental O2.  She was last seen by pulmonology on 03/16/18 by Viviana SimplerJustin Phillip Blaylock, PA-C.  She was stable at this visit on Symbicort and prn albuterol and advised to follow up in 1 year.   Hemoglobin 11.7 at PST visit on 09/28/18, 11.1 05/17/19.   Pt can proceed with planned procedure barring acute status change.   VS: BP 115/67   Pulse (!) 57   Temp 37.3 C (Oral)   Resp 16   Ht 5\' 4"  (1.626 m)   Wt 67.6 kg   SpO2 99%   BMI 25.58 kg/m   PROVIDERS: Ignacia PalmaBeck, Mark C., MD is PCP  Derrek GuKalil, Darryl Alfred, MD is Cardiology   Blaylock, Marvia PicklesJustin Phillip, PA-C with pulmonology   LABS: Labs reviewed: Acceptable for surgery. (all labs ordered are listed, but only abnormal results are displayed)  Labs Reviewed  CBC - Abnormal; Notable for the following components:      Result Value   Hemoglobin 11.7 (*)    All other  components within normal limits  COMPREHENSIVE METABOLIC PANEL - Abnormal; Notable for the following components:   Glucose, Bld 115 (*)    All other components within normal limits  HEMOGLOBIN A1C - Abnormal; Notable for the following components:   Hgb A1c MFr Bld 6.0 (*)    All other components within normal limits  SURGICAL PCR SCREEN  APTT  PROTIME-INR  TYPE AND SCREEN  ABO/RH     IMAGES: CT Chest Abdomen Pelvis w Contrast 06/26/18  IMPRESSION: Postoperative changes from posterior fusion T6-S1. No visible hardware complicating feature or acute bony abnormality. Mild fatty infiltration of the liver. Emphysema. Aortic atherosclerosis. No acute findings in the chest, abdomen or pelvis.  EKG:  02/06/18 (on Chart) Rate 64 bpm Sinus rhythm with premature atrial complexes with aberrant conduction Possible Left atrial enlargement  Nonspecific St-T changes When compared with ECG of 28-Apr-2017 12:51,  Aberrant conduction now present  CV: Echo 03/18/18 (Care Everywhere)  Findings:  Mitral Valve: Structurally normal mitral valve. Moderate mitral regurgitation. Aortic Valve: The bioprosthetic valve is porcine type. Structurally normal bioprosthetic aortic valve with good leaflet mobility, and no regurgitation. Tricuspid Valve: Tricuspid valve is structurally normal. Moderate tricuspid regurgitation. Pulmonic Valve: The pulmonic valve was not well visualized. Mild pulmonic regurgitation. Left Atrium: Normal size left atrium. Left Ventricle Ejection fraction is  visually estimated at 55-60%. Normal left ventricular size and systolic function with no appreciable segmental abnormality. Indeterminate diastolic function. Right Atrium: Mild Right atrial enlargement. Right Ventricle: The right ventricle was not clearly visualized. Right ventricular systolic pressure of 35 mm Hg consistent with mild pulmonary hypertension. Pericardial Effusion: No evidence of pericardial  effusion. Pleural Effusion: No evidence of pleural effusion. Miscellaneous: The IVC is normal. The aortic root diameter is within normal limits.  Past Medical History:  Diagnosis Date  . Arthritis   . Back pain   . COPD (chronic obstructive pulmonary disease) (HCC)    Mild  . Coronary artery disease   . Depression   . GERD (gastroesophageal reflux disease)   . Heart murmur   . History of aortic stenosis    Severe  . History of prolapse of bladder   . Hyperlipidemia   . Hypertension   . Osteoporosis   . Pneumonia   . PONV (postoperative nausea and vomiting)   . Pre-diabetes   . Small vessel disease, cerebrovascular   . Vertebrobasilar artery insufficiency   . Vertigo     Past Surgical History:  Procedure Laterality Date  . ABDOMINAL HYSTERECTOMY    . Ablation on neck    . AORTIC VALVE REPLACEMENT (AVR)/CORONARY ARTERY BYPASS GRAFTING (CABG)  10/19/2013  . BACK SURGERY  03/18/2015  . BLEPHAROPLASTY    . CARDIAC CATHETERIZATION    . COLONOSCOPY  12/09/2007  . SACROSPINOUS LIGAMENT FIXATION  06/25/2016    MEDICATIONS: . albuterol (PROVENTIL) (5 MG/ML) 0.5% nebulizer solution  . aspirin EC 81 MG tablet  . estradiol (ESTRACE) 0.5 MG tablet  . fluticasone furoate-vilanterol (BREO ELLIPTA) 100-25 MCG/INH AEPB  . gabapentin (NEURONTIN) 300 MG capsule  . Glucosamine-Chondroit-Vit C-Mn (GLUCOSAMINE CHONDR 1500 COMPLX PO)  . HYDROcodone-acetaminophen (NORCO/VICODIN) 5-325 MG tablet  . metoprolol tartrate (LOPRESSOR) 25 MG tablet  . Multiple Vitamins-Minerals (MULTIVITAMIN WITH MINERALS) tablet  . omeprazole (PRILOSEC) 40 MG capsule  . rosuvastatin (CRESTOR) 40 MG tablet   No current facility-administered medications for this encounter.     Janey Genta [facility] Pre-Surgical Testing [phone] 09/30/18 9:46 AM

## 2018-10-03 ENCOUNTER — Other Ambulatory Visit: Payer: Self-pay

## 2018-10-03 ENCOUNTER — Inpatient Hospital Stay (HOSPITAL_COMMUNITY): Payer: Medicare Other

## 2018-10-03 ENCOUNTER — Encounter (HOSPITAL_COMMUNITY)
Admission: RE | Disposition: A | Discharge status: Home / Self Care | Payer: Self-pay | Source: Home / Self Care | Attending: Orthopedic Surgery

## 2018-10-03 ENCOUNTER — Inpatient Hospital Stay (HOSPITAL_COMMUNITY): Payer: Medicare Other | Admitting: Anesthesiology

## 2018-10-03 ENCOUNTER — Inpatient Hospital Stay (HOSPITAL_COMMUNITY)
Admission: RE | Admit: 2018-10-03 | Discharge: 2018-10-04 | DRG: 470 | Disposition: A | Discharge status: Home / Self Care | Payer: Medicare Other | Attending: Orthopedic Surgery | Admitting: Orthopedic Surgery

## 2018-10-03 ENCOUNTER — Encounter (HOSPITAL_COMMUNITY): Payer: Self-pay | Admitting: Anesthesiology

## 2018-10-03 ENCOUNTER — Inpatient Hospital Stay (HOSPITAL_COMMUNITY): Payer: Medicare Other | Admitting: Physician Assistant

## 2018-10-03 DIAGNOSIS — Z8701 Personal history of pneumonia (recurrent): Secondary | ICD-10-CM

## 2018-10-03 DIAGNOSIS — Z87891 Personal history of nicotine dependence: Secondary | ICD-10-CM | POA: Diagnosis not present

## 2018-10-03 DIAGNOSIS — Z79899 Other long term (current) drug therapy: Secondary | ICD-10-CM | POA: Diagnosis not present

## 2018-10-03 DIAGNOSIS — Z7951 Long term (current) use of inhaled steroids: Secondary | ICD-10-CM

## 2018-10-03 DIAGNOSIS — Z9181 History of falling: Secondary | ICD-10-CM | POA: Diagnosis not present

## 2018-10-03 DIAGNOSIS — I10 Essential (primary) hypertension: Secondary | ICD-10-CM | POA: Diagnosis present

## 2018-10-03 DIAGNOSIS — M169 Osteoarthritis of hip, unspecified: Secondary | ICD-10-CM | POA: Diagnosis present

## 2018-10-03 DIAGNOSIS — M1612 Unilateral primary osteoarthritis, left hip: Secondary | ICD-10-CM | POA: Diagnosis present

## 2018-10-03 DIAGNOSIS — E785 Hyperlipidemia, unspecified: Secondary | ICD-10-CM | POA: Diagnosis present

## 2018-10-03 DIAGNOSIS — Z951 Presence of aortocoronary bypass graft: Secondary | ICD-10-CM | POA: Diagnosis not present

## 2018-10-03 DIAGNOSIS — R7303 Prediabetes: Secondary | ICD-10-CM | POA: Diagnosis present

## 2018-10-03 DIAGNOSIS — Z885 Allergy status to narcotic agent status: Secondary | ICD-10-CM | POA: Diagnosis not present

## 2018-10-03 DIAGNOSIS — M810 Age-related osteoporosis without current pathological fracture: Secondary | ICD-10-CM | POA: Diagnosis present

## 2018-10-03 DIAGNOSIS — Z90710 Acquired absence of both cervix and uterus: Secondary | ICD-10-CM

## 2018-10-03 DIAGNOSIS — Z96649 Presence of unspecified artificial hip joint: Secondary | ICD-10-CM

## 2018-10-03 DIAGNOSIS — K219 Gastro-esophageal reflux disease without esophagitis: Secondary | ICD-10-CM | POA: Diagnosis present

## 2018-10-03 DIAGNOSIS — Z419 Encounter for procedure for purposes other than remedying health state, unspecified: Secondary | ICD-10-CM

## 2018-10-03 DIAGNOSIS — I2510 Atherosclerotic heart disease of native coronary artery without angina pectoris: Secondary | ICD-10-CM | POA: Diagnosis present

## 2018-10-03 DIAGNOSIS — J449 Chronic obstructive pulmonary disease, unspecified: Secondary | ICD-10-CM | POA: Diagnosis present

## 2018-10-03 DIAGNOSIS — Z79890 Hormone replacement therapy (postmenopausal): Secondary | ICD-10-CM | POA: Diagnosis not present

## 2018-10-03 DIAGNOSIS — Z7982 Long term (current) use of aspirin: Secondary | ICD-10-CM | POA: Diagnosis not present

## 2018-10-03 DIAGNOSIS — M1610 Unilateral primary osteoarthritis, unspecified hip: Secondary | ICD-10-CM | POA: Diagnosis present

## 2018-10-03 DIAGNOSIS — Z952 Presence of prosthetic heart valve: Secondary | ICD-10-CM | POA: Diagnosis not present

## 2018-10-03 HISTORY — PX: TOTAL HIP ARTHROPLASTY: SHX124

## 2018-10-03 LAB — TYPE AND SCREEN
ABO/RH(D): A POS
Antibody Screen: NEGATIVE

## 2018-10-03 SURGERY — ARTHROPLASTY, HIP, TOTAL, ANTERIOR APPROACH
Anesthesia: General | Site: Hip | Laterality: Left

## 2018-10-03 MED ORDER — SUGAMMADEX SODIUM 200 MG/2ML IV SOLN
INTRAVENOUS | Status: DC | PRN
  Administered 2018-10-03: 175 mg via INTRAVENOUS

## 2018-10-03 MED ORDER — FENTANYL CITRATE (PF) 100 MCG/2ML IJ SOLN
25.0000 ug | INTRAMUSCULAR | Status: DC | PRN
  Administered 2018-10-03 (×3): 50 ug via INTRAVENOUS

## 2018-10-03 MED ORDER — METHOCARBAMOL 500 MG IVPB - SIMPLE MED
500.0000 mg | Freq: Four times a day (QID) | INTRAVENOUS | Status: DC | PRN
  Administered 2018-10-03: 500 mg via INTRAVENOUS
  Filled 2018-10-03: qty 50

## 2018-10-03 MED ORDER — 0.9 % SODIUM CHLORIDE (POUR BTL) OPTIME
TOPICAL | Status: DC | PRN
  Administered 2018-10-03: 1000 mL

## 2018-10-03 MED ORDER — ONDANSETRON HCL 4 MG/2ML IJ SOLN
INTRAMUSCULAR | Status: DC | PRN
  Administered 2018-10-03: 4 mg via INTRAVENOUS

## 2018-10-03 MED ORDER — FENTANYL CITRATE (PF) 100 MCG/2ML IJ SOLN
INTRAMUSCULAR | Status: DC | PRN
  Administered 2018-10-03: 50 ug via INTRAVENOUS
  Administered 2018-10-03: 100 ug via INTRAVENOUS
  Administered 2018-10-03: 50 ug via INTRAVENOUS

## 2018-10-03 MED ORDER — ASPIRIN EC 325 MG PO TBEC
325.0000 mg | DELAYED_RELEASE_TABLET | Freq: Two times a day (BID) | ORAL | Status: DC
  Administered 2018-10-04: 325 mg via ORAL
  Filled 2018-10-03: qty 1

## 2018-10-03 MED ORDER — BUPIVACAINE-EPINEPHRINE (PF) 0.25% -1:200000 IJ SOLN
INTRAMUSCULAR | Status: DC | PRN
  Administered 2018-10-03: 30 mL

## 2018-10-03 MED ORDER — TRAMADOL HCL 50 MG PO TABS
50.0000 mg | ORAL_TABLET | Freq: Four times a day (QID) | ORAL | Status: DC | PRN
  Administered 2018-10-03: 50 mg via ORAL
  Administered 2018-10-04: 100 mg via ORAL
  Filled 2018-10-03: qty 2
  Filled 2018-10-03: qty 1

## 2018-10-03 MED ORDER — ROCURONIUM BROMIDE 10 MG/ML (PF) SYRINGE
PREFILLED_SYRINGE | INTRAVENOUS | Status: DC | PRN
Start: 2018-10-03 — End: 2018-10-03
  Administered 2018-10-03: 50 mg via INTRAVENOUS

## 2018-10-03 MED ORDER — MENTHOL 3 MG MT LOZG: 1.0000 | LOZENGE | OROMUCOSAL | Status: DC | PRN

## 2018-10-03 MED ORDER — METOCLOPRAMIDE HCL 5 MG PO TABS: 5.0000 mg | ORAL_TABLET | Freq: Three times a day (TID) | ORAL | Status: DC | PRN

## 2018-10-03 MED ORDER — METHOCARBAMOL 500 MG PO TABS
500.0000 mg | ORAL_TABLET | Freq: Four times a day (QID) | ORAL | Status: DC | PRN
  Administered 2018-10-03 – 2018-10-04 (×3): 500 mg via ORAL
  Filled 2018-10-03 (×3): qty 1

## 2018-10-03 MED ORDER — ALBUTEROL SULFATE (2.5 MG/3ML) 0.083% IN NEBU: 2.5000 mg | INHALATION_SOLUTION | Freq: Four times a day (QID) | RESPIRATORY_TRACT | Status: DC | PRN

## 2018-10-03 MED ORDER — BISACODYL 10 MG RE SUPP: 10.0000 mg | Freq: Every day | RECTAL | Status: DC | PRN

## 2018-10-03 MED ORDER — ONDANSETRON HCL 4 MG/2ML IJ SOLN: 4.0000 mg | Freq: Four times a day (QID) | INTRAMUSCULAR | Status: DC | PRN

## 2018-10-03 MED ORDER — DEXAMETHASONE SODIUM PHOSPHATE 10 MG/ML IJ SOLN
8.0000 mg | Freq: Once | INTRAMUSCULAR | Status: AC
  Administered 2018-10-03: 8 mg via INTRAVENOUS

## 2018-10-03 MED ORDER — MIDAZOLAM HCL 2 MG/2ML IJ SOLN
INTRAMUSCULAR | Status: AC
  Filled 2018-10-03: qty 2

## 2018-10-03 MED ORDER — PROPOFOL 10 MG/ML IV BOLUS
INTRAVENOUS | Status: AC
  Filled 2018-10-03: qty 20

## 2018-10-03 MED ORDER — PANTOPRAZOLE SODIUM 40 MG PO TBEC
80.0000 mg | DELAYED_RELEASE_TABLET | Freq: Every day | ORAL | Status: DC
  Administered 2018-10-04: 80 mg via ORAL
  Filled 2018-10-03: qty 2

## 2018-10-03 MED ORDER — TRANEXAMIC ACID-NACL 1000-0.7 MG/100ML-% IV SOLN
1000.0000 mg | INTRAVENOUS | Status: AC
  Administered 2018-10-03: 1000 mg via INTRAVENOUS
  Filled 2018-10-03: qty 100

## 2018-10-03 MED ORDER — GABAPENTIN 300 MG PO CAPS: 600.0000 mg | ORAL_CAPSULE | ORAL | Status: DC

## 2018-10-03 MED ORDER — EPHEDRINE SULFATE-NACL 50-0.9 MG/10ML-% IV SOSY
PREFILLED_SYRINGE | INTRAVENOUS | Status: DC | PRN
  Administered 2018-10-03: 7.5 mg via INTRAVENOUS
  Administered 2018-10-03 (×2): 5 mg via INTRAVENOUS

## 2018-10-03 MED ORDER — POLYETHYLENE GLYCOL 3350 17 G PO PACK: 17.0000 g | PACK | Freq: Every day | ORAL | Status: DC | PRN

## 2018-10-03 MED ORDER — ACETAMINOPHEN 500 MG PO TABS
500.0000 mg | ORAL_TABLET | Freq: Four times a day (QID) | ORAL | Status: DC
  Administered 2018-10-03 – 2018-10-04 (×3): 500 mg via ORAL
  Filled 2018-10-03 (×3): qty 1

## 2018-10-03 MED ORDER — FENTANYL CITRATE (PF) 100 MCG/2ML IJ SOLN
INTRAMUSCULAR | Status: AC
  Filled 2018-10-03: qty 4

## 2018-10-03 MED ORDER — PHENOL 1.4 % MT LIQD
1.0000 | OROMUCOSAL | Status: DC | PRN
  Filled 2018-10-03: qty 177

## 2018-10-03 MED ORDER — LIDOCAINE 2% (20 MG/ML) 5 ML SYRINGE
INTRAMUSCULAR | Status: DC | PRN
  Administered 2018-10-03: 100 mg via INTRAVENOUS

## 2018-10-03 MED ORDER — CEFAZOLIN SODIUM-DEXTROSE 2-4 GM/100ML-% IV SOLN
2.0000 g | Freq: Four times a day (QID) | INTRAVENOUS | Status: AC
  Administered 2018-10-03 (×2): 2 g via INTRAVENOUS
  Filled 2018-10-03 (×2): qty 100

## 2018-10-03 MED ORDER — FLEET ENEMA 7-19 GM/118ML RE ENEM: 1.0000 | ENEMA | Freq: Once | RECTAL | Status: DC | PRN

## 2018-10-03 MED ORDER — ACETAMINOPHEN 10 MG/ML IV SOLN
1000.0000 mg | Freq: Four times a day (QID) | INTRAVENOUS | Status: DC
  Administered 2018-10-03: 1000 mg via INTRAVENOUS
  Filled 2018-10-03: qty 100

## 2018-10-03 MED ORDER — SUGAMMADEX SODIUM 200 MG/2ML IV SOLN
INTRAVENOUS | Status: AC
  Filled 2018-10-03: qty 2

## 2018-10-03 MED ORDER — DOCUSATE SODIUM 100 MG PO CAPS
100.0000 mg | ORAL_CAPSULE | Freq: Two times a day (BID) | ORAL | Status: DC
  Administered 2018-10-03 – 2018-10-04 (×2): 100 mg via ORAL
  Filled 2018-10-03 (×2): qty 1

## 2018-10-03 MED ORDER — DEXAMETHASONE SODIUM PHOSPHATE 10 MG/ML IJ SOLN
10.0000 mg | Freq: Once | INTRAMUSCULAR | Status: AC
  Administered 2018-10-04: 10 mg via INTRAVENOUS
  Filled 2018-10-03: qty 1

## 2018-10-03 MED ORDER — PROPOFOL 10 MG/ML IV BOLUS
INTRAVENOUS | Status: DC | PRN
  Administered 2018-10-03: 160 mg via INTRAVENOUS

## 2018-10-03 MED ORDER — EPHEDRINE 5 MG/ML INJ
INTRAVENOUS | Status: AC
  Filled 2018-10-03: qty 10

## 2018-10-03 MED ORDER — HYDROMORPHONE HCL 1 MG/ML IJ SOLN
0.5000 mg | INTRAMUSCULAR | Status: DC | PRN
  Administered 2018-10-03: 0.5 mg via INTRAVENOUS
  Filled 2018-10-03: qty 0.5

## 2018-10-03 MED ORDER — DIPHENHYDRAMINE HCL 12.5 MG/5ML PO ELIX: 12.5000 mg | ORAL_SOLUTION | ORAL | Status: DC | PRN

## 2018-10-03 MED ORDER — HYDROMORPHONE HCL 1 MG/ML IJ SOLN
INTRAMUSCULAR | Status: AC
  Filled 2018-10-03: qty 2

## 2018-10-03 MED ORDER — ROSUVASTATIN CALCIUM 20 MG PO TABS
40.0000 mg | ORAL_TABLET | Freq: Every day | ORAL | Status: DC
  Administered 2018-10-04: 40 mg via ORAL
  Filled 2018-10-03: qty 2

## 2018-10-03 MED ORDER — GABAPENTIN 300 MG PO CAPS
600.0000 mg | ORAL_CAPSULE | Freq: Two times a day (BID) | ORAL | Status: DC
  Administered 2018-10-03 – 2018-10-04 (×2): 600 mg via ORAL
  Filled 2018-10-03 (×2): qty 2

## 2018-10-03 MED ORDER — METOPROLOL TARTRATE 25 MG PO TABS
25.0000 mg | ORAL_TABLET | Freq: Two times a day (BID) | ORAL | Status: DC
  Administered 2018-10-03 – 2018-10-04 (×2): 25 mg via ORAL
  Filled 2018-10-03 (×2): qty 1

## 2018-10-03 MED ORDER — GABAPENTIN 300 MG PO CAPS
900.0000 mg | ORAL_CAPSULE | Freq: Every day | ORAL | Status: DC
  Administered 2018-10-03: 900 mg via ORAL
  Filled 2018-10-03: qty 3

## 2018-10-03 MED ORDER — CEFAZOLIN SODIUM-DEXTROSE 2-4 GM/100ML-% IV SOLN
2.0000 g | INTRAVENOUS | Status: AC
  Administered 2018-10-03: 2 g via INTRAVENOUS
  Filled 2018-10-03: qty 100

## 2018-10-03 MED ORDER — HYDROCODONE-ACETAMINOPHEN 5-325 MG PO TABS
1.0000 | ORAL_TABLET | ORAL | Status: DC | PRN
  Administered 2018-10-03: 2 via ORAL
  Administered 2018-10-03: 1 via ORAL
  Administered 2018-10-04 (×2): 2 via ORAL
  Filled 2018-10-03 (×2): qty 2
  Filled 2018-10-03: qty 1
  Filled 2018-10-03: qty 2

## 2018-10-03 MED ORDER — ONDANSETRON HCL 4 MG PO TABS: 4.0000 mg | ORAL_TABLET | Freq: Four times a day (QID) | ORAL | Status: DC | PRN

## 2018-10-03 MED ORDER — METHOCARBAMOL 500 MG IVPB - SIMPLE MED
INTRAVENOUS | Status: AC
  Filled 2018-10-03: qty 50

## 2018-10-03 MED ORDER — FENTANYL CITRATE (PF) 100 MCG/2ML IJ SOLN
INTRAMUSCULAR | Status: AC
  Filled 2018-10-03: qty 2

## 2018-10-03 MED ORDER — SODIUM CHLORIDE 0.9 % IV SOLN
INTRAVENOUS | Status: DC
  Administered 2018-10-03 – 2018-10-04 (×2): via INTRAVENOUS

## 2018-10-03 MED ORDER — MIDAZOLAM HCL 5 MG/5ML IJ SOLN
INTRAMUSCULAR | Status: DC | PRN
  Administered 2018-10-03: 1 mg via INTRAVENOUS

## 2018-10-03 MED ORDER — FENTANYL CITRATE (PF) 100 MCG/2ML IJ SOLN: 25.0000 ug | INTRAMUSCULAR | Status: DC | PRN

## 2018-10-03 MED ORDER — LACTATED RINGERS IV SOLN
INTRAVENOUS | Status: DC
  Administered 2018-10-03 (×2): via INTRAVENOUS

## 2018-10-03 MED ORDER — HYDROMORPHONE HCL 1 MG/ML IJ SOLN
0.2500 mg | INTRAMUSCULAR | Status: DC | PRN
  Administered 2018-10-03 (×4): 0.5 mg via INTRAVENOUS

## 2018-10-03 MED ORDER — CHLORHEXIDINE GLUCONATE 4 % EX LIQD: 60.0000 mL | Freq: Once | CUTANEOUS | Status: DC

## 2018-10-03 MED ORDER — METOCLOPRAMIDE HCL 5 MG/ML IJ SOLN: 5.0000 mg | Freq: Three times a day (TID) | INTRAMUSCULAR | Status: DC | PRN

## 2018-10-03 MED ORDER — FLUTICASONE FUROATE-VILANTEROL 100-25 MCG/INH IN AEPB
1.0000 | INHALATION_SPRAY | Freq: Every day | RESPIRATORY_TRACT | Status: DC | PRN
  Filled 2018-10-03: qty 28

## 2018-10-03 MED ORDER — STERILE WATER FOR IRRIGATION IR SOLN
Status: DC | PRN
  Administered 2018-10-03: 2000 mL

## 2018-10-03 MED ORDER — BUPIVACAINE-EPINEPHRINE (PF) 0.25% -1:200000 IJ SOLN
INTRAMUSCULAR | Status: AC
  Filled 2018-10-03: qty 30

## 2018-10-03 SURGICAL SUPPLY — 45 items
BAG DECANTER FOR FLEXI CONT (MISCELLANEOUS) ×3 IMPLANT
BAG ZIPLOCK 12X15 (MISCELLANEOUS) IMPLANT
BLADE SAG 18X100X1.27 (BLADE) ×3 IMPLANT
BLADE SURG SZ10 CARB STEEL (BLADE) ×6 IMPLANT
CLOSURE WOUND 1/2 X4 (GAUZE/BANDAGES/DRESSINGS) ×1
COVER PERINEAL POST (MISCELLANEOUS) ×3 IMPLANT
COVER SURGICAL LIGHT HANDLE (MISCELLANEOUS) ×3 IMPLANT
COVER WAND RF STERILE (DRAPES) IMPLANT
CUP ACETBLR 48 OD SECTOR II (Hips) ×3 IMPLANT
DECANTER SPIKE VIAL GLASS SM (MISCELLANEOUS) ×3 IMPLANT
DRAPE STERI IOBAN 125X83 (DRAPES) ×3 IMPLANT
DRAPE U-SHAPE 47X51 STRL (DRAPES) ×6 IMPLANT
DRSG ADAPTIC 3X8 NADH LF (GAUZE/BANDAGES/DRESSINGS) ×3 IMPLANT
DRSG MEPILEX BORDER 4X4 (GAUZE/BANDAGES/DRESSINGS) ×3 IMPLANT
DRSG MEPILEX BORDER 4X8 (GAUZE/BANDAGES/DRESSINGS) ×3 IMPLANT
DURAPREP 26ML APPLICATOR (WOUND CARE) ×3 IMPLANT
ELECT REM PT RETURN 15FT ADLT (MISCELLANEOUS) ×3 IMPLANT
EVACUATOR 1/8 PVC DRAIN (DRAIN) ×3 IMPLANT
GLOVE BIO SURGEON STRL SZ7 (GLOVE) ×3 IMPLANT
GLOVE BIO SURGEON STRL SZ8 (GLOVE) ×3 IMPLANT
GLOVE BIOGEL PI IND STRL 7.0 (GLOVE) ×3 IMPLANT
GLOVE BIOGEL PI IND STRL 7.5 (GLOVE) ×2 IMPLANT
GLOVE BIOGEL PI IND STRL 8 (GLOVE) ×1 IMPLANT
GLOVE BIOGEL PI INDICATOR 7.0 (GLOVE) ×6
GLOVE BIOGEL PI INDICATOR 7.5 (GLOVE) ×4
GLOVE BIOGEL PI INDICATOR 8 (GLOVE) ×2
GLOVE SURG SS PI 7.0 STRL IVOR (GLOVE) ×3 IMPLANT
GOWN STRL REUS W/TWL LRG LVL3 (GOWN DISPOSABLE) ×6 IMPLANT
GOWN STRL REUS W/TWL XL LVL3 (GOWN DISPOSABLE) ×6 IMPLANT
HEAD CERAMIC DELTA 28M 12/14P5 (Head) ×3 IMPLANT
HOLDER FOLEY CATH W/STRAP (MISCELLANEOUS) ×3 IMPLANT
LINER MARATHON 28 48 (Hips) ×3 IMPLANT
MANIFOLD NEPTUNE II (INSTRUMENTS) ×3 IMPLANT
PACK ANTERIOR HIP CUSTOM (KITS) ×3 IMPLANT
STEM FEMORAL SZ5 HIGH ACTIS (Nail) ×3 IMPLANT
STRIP CLOSURE SKIN 1/2X4 (GAUZE/BANDAGES/DRESSINGS) ×2 IMPLANT
SUT ETHIBOND NAB CT1 #1 30IN (SUTURE) ×3 IMPLANT
SUT MNCRL AB 4-0 PS2 18 (SUTURE) ×3 IMPLANT
SUT STRATAFIX 0 PDS 27 VIOLET (SUTURE) ×3
SUT VIC AB 2-0 CT1 27 (SUTURE) ×4
SUT VIC AB 2-0 CT1 TAPERPNT 27 (SUTURE) ×2 IMPLANT
SUTURE STRATFX 0 PDS 27 VIOLET (SUTURE) ×1 IMPLANT
SYR 50ML LL SCALE MARK (SYRINGE) IMPLANT
TRAY FOLEY MTR SLVR 16FR STAT (SET/KITS/TRAYS/PACK) ×3 IMPLANT
YANKAUER SUCT BULB TIP 10FT TU (MISCELLANEOUS) ×3 IMPLANT

## 2018-10-03 NOTE — Interval H&P Note (Signed)
History and Physical Interval Note:  10/03/2018 8:28 AM  Karen Woods  has presented today for surgery, with the diagnosis of left hip osteoarthritis  The various methods of treatment have been discussed with the patient and family. After consideration of risks, benefits and other options for treatment, the patient has consented to  Procedure(s) with comments: TOTAL HIP ARTHROPLASTY ANTERIOR APPROACH (Left) - as a surgical intervention .  The patient's history has been reviewed, patient examined, no change in status, stable for surgery.  I have reviewed the patient's chart and labs.  Questions were answered to the patient's satisfaction.     Homero Fellers Durga Saldarriaga

## 2018-10-03 NOTE — Anesthesia Postprocedure Evaluation (Signed)
Anesthesia Post Note  Patient: Karen Woods  Procedure(s) Performed: TOTAL HIP ARTHROPLASTY ANTERIOR APPROACH (Left Hip)     Patient location during evaluation: PACU Anesthesia Type: General Level of consciousness: awake Pain management: pain level controlled Vital Signs Assessment: post-procedure vital signs reviewed and stable Respiratory status: spontaneous breathing Cardiovascular status: stable Postop Assessment: no apparent nausea or vomiting Anesthetic complications: no    Last Vitals:  Vitals:   10/03/18 1428 10/03/18 1526  BP: 126/74 120/68  Pulse: (!) 54 (!) 57  Resp: 16 16  Temp: (!) 36.3 C 36.6 C  SpO2: 100% 98%    Last Pain:  Vitals:   10/03/18 1605  TempSrc:   PainSc: 6                  Libra Gatz

## 2018-10-03 NOTE — Evaluation (Signed)
Physical Therapy Evaluation Patient Details Name: Karen Woods MRN: [id number] DOB: [birth year] Today's Date: 10/03/2018   History of Present Illness  L DATHA, extensive back surgery  Clinical Impression  Patient ambualated x 100'. Plans Dc to home with family assisting. Pt admitted with above diagnosis. Pt currently with functional limitations due to the deficits listed below (see PT Problem List).  Pt will benefit from skilled PT to increase their independence and safety with mobility to allow discharge to the venue listed below.       Follow Up Recommendations Follow surgeon's recommendation for DC plan and follow-up therapies    Equipment Recommendations  None recommended by PT    Recommendations for Other Services       Precautions / Restrictions Precautions Precautions: Fall Restrictions Weight Bearing Restrictions: No      Mobility  Bed Mobility Overal bed mobility: Needs Assistance Bed Mobility: Supine to Sit     Supine to sit: Supervision     General bed mobility comments: self assisted to sitting  Transfers Overall transfer level: Needs assistance Equipment used: Rolling walker (2 wheeled) Transfers: Sit to/from Stand Sit to Stand: Min assist         General transfer comment: cues for hand and left leg and safety  Ambulation/Gait Ambulation/Gait assistance: Min assist Gait Distance (Feet): 100 Feet Assistive device: Rolling walker (2 wheeled) Gait Pattern/deviations: Step-to pattern;Step-through pattern     General Gait Details: cues for sequence  Stairs            Wheelchair Mobility    Modified Rankin (Stroke Patients Only)       Balance                                             Pertinent Vitals/Pain Pain Assessment: 0-10 Pain Score: 5  Pain Location: left hip Pain Descriptors / Indicators: Numbness Pain Intervention(s): Monitored during session;Premedicated before session;Repositioned    Home Living  Family/patient expects to be discharged to:: Private residence Living Arrangements: Alone;Other relatives;Spouse/significant other Available Help at Discharge: Family Type of Home: Mobile home Home Access: Stairs to enter Entrance Stairs-Rails: Right Entrance Stairs-Number of Steps: 3 Home Layout: One level Home Equipment: Walker - 2 wheels;Toilet riser;Shower seat      Prior Function Level of Independence: Independent with assistive device(s)               Hand Dominance        Extremity/Trunk Assessment   Upper Extremity Assessment Upper Extremity Assessment: Overall WFL for tasks assessed    Lower Extremity Assessment Lower Extremity Assessment: LLE deficits/detail LLE Deficits / Details: advances the left leg during ambualtion    Cervical / Trunk Assessment Cervical / Trunk Assessment: Normal  Communication   Communication: No difficulties  Cognition Arousal/Alertness: Awake/alert Behavior During Therapy: WFL for tasks assessed/performed Overall Cognitive Status: Within Functional Limits for tasks assessed                                        General Comments      Exercises     Assessment/Plan    PT Assessment Patient needs continued PT services  PT Problem List Decreased strength;Decreased range of motion;Decreased activity tolerance;Decreased mobility;Decreased knowledge of precautions;Decreased safety awareness;Decreased knowledge of use of DME  PT Treatment Interventions DME instruction;Therapeutic exercise;Gait training;Stair training;Functional mobility training;Patient/family education    PT Goals (Current goals can be found in the Care Plan section)  Acute Rehab PT Goals Patient Stated Goal: go home PT Goal Formulation: With patient Time For Goal Achievement: 10/08/18 Potential to Achieve Goals: Good    Frequency 7X/week   Barriers to discharge        Co-evaluation               AM-PAC PT "6 Clicks"  Mobility  Outcome Measure Help needed turning from your back to your side while in a flat bed without using bedrails?: None Help needed moving from lying on your back to sitting on the side of a flat bed without using bedrails?: A Little Help needed moving to and from a bed to a chair (including a wheelchair)?: A Little Help needed standing up from a chair using your arms (e.g., wheelchair or bedside chair)?: A Little Help needed to walk in hospital room?: A Little Help needed climbing 3-5 steps with a railing? : A Little 6 Click Score: 19    End of Session Equipment Utilized During Treatment: Gait belt Activity Tolerance: Patient tolerated treatment well Patient left: in chair;with call bell/phone within reach;with chair alarm set Nurse Communication: Mobility status PT Visit Diagnosis: Unsteadiness on feet (R26.81)    Time: [id number] PT Time Calculation (min) (ACUTE ONLY): 23 min   Charges:   PT Evaluation $PT Eval Low Complexity: 1 Low PT Treatments $Gait Training: 8-22 mins        Blanchard KelchKaren Vence Woods PT Acute Rehabilitation Services Pager [phone] Office [phone]   Rada HayHill, Karen Woods Karen 10/03/2018, 5:33 PM

## 2018-10-03 NOTE — Transfer of Care (Signed)
Immediate Anesthesia Transfer of Care Note  Patient: Karen Woods  Procedure(s) Performed: TOTAL HIP ARTHROPLASTY ANTERIOR APPROACH (Left Hip)  Patient Location: PACU  Anesthesia Type:General  Level of Consciousness: awake, drowsy and patient cooperative  Airway & Oxygen Therapy: Patient Spontanous Breathing and Patient connected to face mask oxygen  Post-op Assessment: Report given to RN and Post -op Vital signs reviewed and stable  Post vital signs: Reviewed and stable  Last Vitals:  Vitals Value Taken Time  BP 156/72 10/03/2018 11:20 AM  Temp    Pulse 74 10/03/2018 11:23 AM  Resp 19 10/03/2018 11:23 AM  SpO2 100 % 10/03/2018 11:23 AM  Vitals shown include unvalidated device data.  Last Pain:  Vitals:   10/03/18 0810  TempSrc:   PainSc: 0-No pain      Patients Stated Pain Goal: 4 (10/03/18 0810)  Complications: No apparent anesthesia complications

## 2018-10-03 NOTE — Discharge Instructions (Signed)
°Dr. Frank Aluisio °Total Joint Specialist °[facility] °[address] °[place], [place] [address] °[phone] ° °ANTERIOR APPROACH TOTAL HIP REPLACEMENT POSTOPERATIVE DIRECTIONS ° ° °Hip Rehabilitation, Guidelines Following Surgery  °The results of a hip operation are greatly improved after range of motion and muscle strengthening exercises. Follow all safety measures which are given to protect your hip. If any of these exercises cause increased pain or swelling in your joint, decrease the amount until you are comfortable again. Then slowly increase the exercises. Call your caregiver if you have problems or questions.  ° °HOME CARE INSTRUCTIONS  °• Remove items at home which could result in a fall. This includes throw rugs or furniture in walking pathways.  °· ICE to the affected hip every three hours for 30 minutes at a time and then as needed for pain and swelling.  Continue to use ice on the hip for pain and swelling from surgery. You may notice swelling that will progress down to the foot and ankle.  This is normal after surgery.  Elevate the leg when you are not up walking on it.   °· Continue to use the breathing machine which will help keep your temperature down.  It is common for your temperature to cycle up and down following surgery, especially at night when you are not up moving around and exerting yourself.  The breathing machine keeps your lungs expanded and your temperature down. ° °DIET °You may resume your previous home diet once your are discharged from the hospital. ° °DRESSING / WOUND CARE / SHOWERING °You may shower 3 days after surgery, but keep the wounds dry during showering.  You may use an occlusive plastic wrap (Press'n Seal for example), NO SOAKING/SUBMERGING IN THE BATHTUB.  If the bandage gets wet, change with a clean dry gauze.  If the incision gets wet, pat the wound dry with a clean towel. °You may start showering once you are discharged home but do not submerge the  incision under water. Just pat the incision dry and apply a dry gauze dressing on daily. °Change the surgical dressing daily and reapply a dry dressing each time. ° °ACTIVITY °Walk with your walker as instructed. °Use walker as long as suggested by your caregivers. °Avoid periods of inactivity such as sitting longer than an hour when not asleep. This helps prevent blood clots.  °You may resume a sexual relationship in one month or when given the OK by your doctor.  °You may return to work once you are cleared by your doctor.  °Do not drive a car for 6 weeks or until released by you surgeon.  °Do not drive while taking narcotics. ° °WEIGHT BEARING °Weight bearing as tolerated with assist device (walker, cane, etc) as directed, use it as long as suggested by your surgeon or therapist, typically at least 4-6 weeks. ° °POSTOPERATIVE CONSTIPATION PROTOCOL °Constipation - defined medically as fewer than three stools per week and severe constipation as less than one stool per week. ° °One of the most common issues patients have following surgery is constipation.  Even if you have a regular bowel pattern at home, your normal regimen is likely to be disrupted due to multiple reasons following surgery.  Combination of anesthesia, postoperative narcotics, change in appetite and fluid intake all can affect your bowels.  In order to avoid complications following surgery, here are some recommendations in order to help you during your recovery period. ° °Colace (docusate) - Pick up an over-the-counter form   of Colace or another stool softener and take twice a day as long as you are requiring postoperative pain medications.  Take with a full glass of water daily.  If you experience loose stools or diarrhea, hold the colace until you stool forms back up.  If your symptoms do not get better within 1 week or if they get worse, check with your doctor. ° °Dulcolax (bisacodyl) - Pick up over-the-counter and take as directed by the product  packaging as needed to assist with the movement of your bowels.  Take with a full glass of water.  Use this product as needed if not relieved by Colace only.  ° °MiraLax (polyethylene glycol) - Pick up over-the-counter to have on hand.  MiraLax is a solution that will increase the amount of water in your bowels to assist with bowel movements.  Take as directed and can mix with a glass of water, juice, soda, coffee, or tea.  Take if you go more than two days without a movement. °Do not use MiraLax more than once per day. Call your doctor if you are still constipated or irregular after using this medication for 7 days in a row. ° °If you continue to have problems with postoperative constipation, please contact the office for further assistance and recommendations.  If you experience "the worst abdominal pain ever" or develop nausea or vomiting, please contact the office immediatly for further recommendations for treatment. ° °ITCHING ° If you experience itching with your medications, try taking only a single pain pill, or even half a pain pill at a time.  You can also use Benadryl over the counter for itching or also to help with sleep.  ° °TED HOSE STOCKINGS °Wear the elastic stockings on both legs for three weeks following surgery during the day but you may remove then at night for sleeping. ° °MEDICATIONS °See your medication summary on the “After Visit Summary” that the nursing staff will review with you prior to discharge.  You may have some home medications which will be placed on hold until you complete the course of blood thinner medication.  It is important for you to complete the blood thinner medication as prescribed by your surgeon.  Continue your approved medications as instructed at time of discharge. ° °PRECAUTIONS °If you experience chest pain or shortness of breath - call 911 immediately for transfer to the hospital emergency department.  °If you develop a fever greater that 101 F, purulent drainage  from wound, increased redness or drainage from wound, foul odor from the wound/dressing, or calf pain - CONTACT YOUR SURGEON.   °                                                °FOLLOW-UP APPOINTMENTS °Make sure you keep all of your appointments after your operation with your surgeon and caregivers. You should call the office at the above phone number and make an appointment for approximately two weeks after the date of your surgery or on the date instructed by your surgeon outlined in the "After Visit Summary". ° °RANGE OF MOTION AND STRENGTHENING EXERCISES  °These exercises are designed to help you keep full movement of your hip joint. Follow your caregiver's or physical therapist's instructions. Perform all exercises about fifteen times, three times per day or as directed. Exercise both hips, even if you have   had only one joint replacement. These exercises can be done on a training (exercise) mat, on the floor, on a table or on a bed. Use whatever works the best and is most comfortable for you. Use music or television while you are exercising so that the exercises are a pleasant break in your day. This will make your life better with the exercises acting as a break in routine you can look forward to.  °• Lying on your back, slowly slide your foot toward your buttocks, raising your knee up off the floor. Then slowly slide your foot back down until your leg is straight again.  °• Lying on your back spread your legs as far apart as you can without causing discomfort.  °• Lying on your side, raise your upper leg and foot straight up from the floor as far as is comfortable. Slowly lower the leg and repeat.  °• Lying on your back, tighten up the muscle in the front of your thigh (quadriceps muscles). You can do this by keeping your leg straight and trying to raise your heel off the floor. This helps strengthen the largest muscle supporting your knee.  °• Lying on your back, tighten up the muscles of your buttocks both  with the legs straight and with the knee bent at a comfortable angle while keeping your heel on the floor.  ° °IF YOU ARE TRANSFERRED TO A SKILLED REHAB FACILITY °If the patient is transferred to a skilled rehab facility following release from the hospital, a list of the current medications will be sent to the facility for the patient to continue.  When discharged from the skilled rehab facility, please have the facility set up the patient's Home Health Physical Therapy prior to being released. Also, the skilled facility will be responsible for providing the patient with their medications at time of release from the facility to include their pain medication, the muscle relaxants, and their blood thinner medication. If the patient is still at the rehab facility at time of the two week follow up appointment, the skilled rehab facility will also need to assist the patient in arranging follow up appointment in our office and any transportation needs. ° °MAKE SURE YOU:  °• Understand these instructions.  °• Get help right away if you are not doing well or get worse.  ° ° °Pick up stool softner and laxative for home use following surgery while on pain medications. °Do not submerge incision under water. °Please use good hand washing techniques while changing dressing each day. °May shower starting three days after surgery. °Please use a clean towel to pat the incision dry following showers. °Continue to use ice for pain and swelling after surgery. °Do not use any lotions or creams on the incision until instructed by your surgeon. ° °

## 2018-10-03 NOTE — Op Note (Signed)
OPERATIVE REPORT- TOTAL HIP ARTHROPLASTY   PREOPERATIVE DIAGNOSIS: Osteoarthritis of the Left hip.   POSTOPERATIVE DIAGNOSIS: Osteoarthritis of the Left  hip.   PROCEDURE: Left total hip arthroplasty, anterior approach.   SURGEON: Ollen Gross, MD   ASSISTANT: Arther Abbott, PA-C  ANESTHESIA:  General  ESTIMATED BLOOD LOSS:-250 mL    DRAINS: Hemovac x1.   COMPLICATIONS: None   CONDITION: PACU - hemodynamically stable.   BRIEF CLINICAL NOTE: Karen Woods is a 73 y.o. female who has advanced end-  stage arthritis of their Left  hip with progressively worsening pain and  dysfunction.The patient has failed nonoperative management and presents for  total hip arthroplasty.   PROCEDURE IN DETAIL: After successful administration of spinal  anesthetic, the traction boots for the [facility] bed were placed on both  feet and the patient was placed onto the [facility] bed, boots placed into the leg  holders. The Left hip was then isolated from the perineum with plastic  drapes and prepped and draped in the usual sterile fashion. ASIS and  greater trochanter were marked and a oblique incision was made, starting  at about 1 cm lateral and 2 cm distal to the ASIS and coursing towards  the anterior cortex of the femur. The skin was cut with a 10 blade  through subcutaneous tissue to the level of the fascia overlying the  tensor fascia lata muscle. The fascia was then incised in line with the  incision at the junction of the anterior third and posterior 2/3rd. The  muscle was teased off the fascia and then the interval between the TFL  and the rectus was developed. The Hohmann retractor was then placed at  the top of the femoral neck over the capsule. The vessels overlying the  capsule were cauterized and the fat on top of the capsule was removed.  A Hohmann retractor was then placed anterior underneath the rectus  femoris to give exposure to the entire anterior capsule. A T-shaped   capsulotomy was performed. The edges were tagged and the femoral head  was identified.       Osteophytes are removed off the superior acetabulum.  The femoral neck was then cut in situ with an oscillating saw. Traction  was then applied to the left lower extremity utilizing the [facility]  traction. The femoral head was then removed. Retractors were placed  around the acetabulum and then circumferential removal of the labrum was  performed. Osteophytes were also removed. Reaming starts at 45 mm to  medialize and  Increased in 2 mm increments to 47 mm. We reamed in  approximately 40 degrees of abduction, 20 degrees anteversion. A 48 mm  pinnacle acetabular shell was then impacted in anatomic position under  fluoroscopic guidance with excellent purchase. We did not need to place  any additional dome screws. A 28 mm neutral + 4 marathon liner was then  placed into the acetabular shell.       The femoral lift was then placed along the lateral aspect of the femur  just distal to the vastus ridge. The leg was  externally rotated and capsule  was stripped off the inferior aspect of the femoral neck down to the  level of the lesser trochanter, this was done with electrocautery. The femur was lifted after this was performed. The  leg was then placed in an extended and adducted position essentially delivering the femur. We also removed the capsule superiorly and the piriformis from the piriformis fossa  to gain excellent exposure of the  proximal femur. Rongeur was used to remove some cancellous bone to get  into the lateral portion of the proximal femur for placement of the  initial starter reamer. The starter broaches was placed  the starter broach  and was shown to go down the center of the canal. Broaching  with the Actis system was then performed starting at size 0  coursing  Up to size 5. A size 5 had excellent torsional and rotational  and axial stability. The trial high offset neck was then placed   with a 28 + 5 trial head. The hip was then reduced. We confirmed that  the stem was in the canal both on AP and lateral x-rays. It also has excellent sizing. The hip was reduced with outstanding stability through full extension and full external rotation.. AP pelvis was taken and the leg lengths were measured and found to be equal. Hip was then dislocated again and the femoral head and neck removed. The  femoral broach was removed. Size 5 Actis stem with a high offset  neck was then impacted into the femur following native anteversion. Has  excellent purchase in the canal. Excellent torsional and rotational and  axial stability. It is confirmed to be in the canal on AP and lateral  fluoroscopic views. The 28 + 5 ceramic head was placed and the hip  reduced with outstanding stability. Again AP pelvis was taken and it  confirmed that the leg lengths were equal. The wound was then copiously  irrigated with saline solution and the capsule reattached and repaired  with Ethibond suture. 30 ml of .25% Bupivicaine was  injected into the capsule and into the edge of the tensor fascia lata as well as subcutaneous tissue. The fascia overlying the tensor fascia lata was then closed with a running #1 V-Loc. Subcu was closed with interrupted 2-0 Vicryl and subcuticular running 4-0 Monocryl. Incision was cleaned  and dried. Steri-Strips and a bulky sterile dressing applied. Hemovac  drain was hooked to suction and then the patient was awakened and transported to  recovery in stable condition.        Please note that a surgical assistant was a medical necessity for this procedure to perform it in a safe and expeditious manner. Assistant was necessary to provide appropriate retraction of vital neurovascular structures and to prevent femoral fracture and allow for anatomic placement of the prosthesis.  Ollen GrossFrank Favor Hackler, M.D.

## 2018-10-03 NOTE — Plan of Care (Signed)
Plan of care 

## 2018-10-03 NOTE — Anesthesia Procedure Notes (Signed)
Procedure Name: Intubation Date/Time: 10/03/2018 9:38 AM Performed by: Lavina Hamman, CRNA Pre-anesthesia Checklist: Patient identified, Emergency Drugs available, Suction available, Patient being monitored and Timeout performed Patient Re-evaluated:Patient Re-evaluated prior to induction Oxygen Delivery Method: Circle system utilized Preoxygenation: Pre-oxygenation with 100% oxygen Induction Type: IV induction Ventilation: Mask ventilation without difficulty Laryngoscope Size: Mac and 3 Grade View: Grade I Tube type: Oral Tube size: 7.0 mm Number of attempts: 1 Airway Equipment and Method: Stylet Placement Confirmation: ETT inserted through vocal cords under direct vision,  positive ETCO2,  CO2 detector and breath sounds checked- equal and bilateral Secured at: 22 cm Tube secured with: Tape Dental Injury: Teeth and Oropharynx as per pre-operative assessment

## 2018-10-04 ENCOUNTER — Encounter (HOSPITAL_COMMUNITY): Payer: Self-pay | Admitting: Orthopedic Surgery

## 2018-10-04 LAB — CBC
HCT: 32.1 % — ABNORMAL LOW (ref 36.0–46.0)
Hemoglobin: 9.8 g/dL — ABNORMAL LOW (ref 12.0–15.0)
MCH: 27.8 pg (ref 26.0–34.0)
MCHC: 30.5 g/dL (ref 30.0–36.0)
MCV: 91.2 fL (ref 80.0–100.0)
Platelets: 267 10*3/uL (ref 150–400)
RBC: 3.52 MIL/uL — ABNORMAL LOW (ref 3.87–5.11)
RDW: 14.2 % (ref 11.5–15.5)
WBC: 11.4 10*3/uL — ABNORMAL HIGH (ref 4.0–10.5)
nRBC: 0 % (ref 0.0–0.2)

## 2018-10-04 LAB — BASIC METABOLIC PANEL
Anion gap: 9 (ref 5–15)
BUN: 8 mg/dL (ref 8–23)
CO2: 23 mmol/L (ref 22–32)
Calcium: 8.6 mg/dL — ABNORMAL LOW (ref 8.9–10.3)
Chloride: 107 mmol/L (ref 98–111)
Creatinine, Ser: 0.56 mg/dL (ref 0.44–1.00)
GFR calc Af Amer: >60 mL/min (ref >60)
GFR calc non Af Amer: >60 mL/min (ref >60)
GLUCOSE: 111 mg/dL — AB (ref 70–99)
Potassium: 4.4 mmol/L (ref 3.5–5.1)
Sodium: 139 mmol/L (ref 135–145)

## 2018-10-04 MED ORDER — HYDROCODONE-ACETAMINOPHEN 5-325 MG PO TABS: 1.0000 | ORAL_TABLET | Freq: Four times a day (QID) | ORAL | 0 refills | Status: DC | PRN

## 2018-10-04 MED ORDER — ALUM & MAG HYDROXIDE-SIMETH 200-200-20 MG/5ML PO SUSP
30.0000 mL | Freq: Four times a day (QID) | ORAL | Status: DC | PRN
  Administered 2018-10-04: 30 mL via ORAL
  Filled 2018-10-04: qty 30

## 2018-10-04 MED ORDER — TRAMADOL HCL 50 MG PO TABS: 50.0000 mg | ORAL_TABLET | Freq: Four times a day (QID) | ORAL | 0 refills | Status: DC | PRN

## 2018-10-04 MED ORDER — SODIUM CHLORIDE 0.9 % IV BOLUS
250.0000 mL | Freq: Once | INTRAVENOUS | Status: AC
  Administered 2018-10-04: 250 mL via INTRAVENOUS

## 2018-10-04 MED ORDER — METHOCARBAMOL 500 MG PO TABS: 500.0000 mg | ORAL_TABLET | Freq: Four times a day (QID) | ORAL | 0 refills | Status: DC | PRN

## 2018-10-04 MED ORDER — ASPIRIN 325 MG PO TBEC: 325.0000 mg | DELAYED_RELEASE_TABLET | Freq: Two times a day (BID) | ORAL | 0 refills | Status: AC

## 2018-10-04 NOTE — Progress Notes (Signed)
   Subjective: 1 Day Post-Op Procedure(s) (LRB): TOTAL HIP ARTHROPLASTY ANTERIOR APPROACH (Left) Patient reports pain as moderate.   Patient seen in rounds by Dr. Lequita Halt. Patient is having problems with back pain as well as pain in the left hip. History of extensive back surgery. Foley catheter to be removed this AM. Denies chest pain or SOB. No issues overnight. We will continue therapy today.   Objective: Vital signs in last 24 hours: Temp:  [97.4 F (36.3 C)-98.5 F (36.9 C)] 98.4 F (36.9 C) (01/14 0533) Pulse Rate:  [54-68] 57 (01/14 0533) Resp:  [12-17] 16 (01/14 0533) BP: (100-156)/(55-82) 100/57 (01/14 0533) SpO2:  [94 %-100 %] 100 % (01/14 0533) Weight:  [67 kg-67.6 kg] 67 kg (01/13 1229)  Intake/Output from previous day:  Intake/Output Summary (Last 24 hours) at 10/04/2018 0659 Last data filed at 10/04/2018 0622 Gross per 24 hour  Intake 4092.03 ml  Output 2660 ml  Net 1432.03 ml     Intake/Output this shift: Total I/O In: 1281.2 [P.O.:300; I.V.:881.2; IV Piggyback:100] Out: 1550 [Urine:1525; Drains:25]  Labs: Recent Labs    10/04/18 0405  HGB 9.8*   Recent Labs    10/04/18 0405  WBC 11.4*  RBC 3.52*  HCT 32.1*  PLT 267   Recent Labs    10/04/18 0405  NA 139  K 4.4  CL 107  CO2 23  BUN 8  CREATININE 0.56  GLUCOSE 111*  CALCIUM 8.6*   Exam: General - Patient is Alert and Oriented Extremity - Neurologically intact Neurovascular intact Sensation intact distally Dorsiflexion/Plantar flexion intact Dressing - dressing C/D/I Motor Function - intact, moving foot and toes well on exam.   Past Medical History:  Diagnosis Date  . Arthritis   . Back pain   . COPD (chronic obstructive pulmonary disease) (HCC)    Mild  . Coronary artery disease   . Depression   . GERD (gastroesophageal reflux disease)   . Heart murmur   . History of aortic stenosis    Severe  . History of prolapse of bladder   . Hyperlipidemia   . Hypertension   .  Osteoporosis   . Pneumonia   . PONV (postoperative nausea and vomiting)   . Pre-diabetes   . Small vessel disease, cerebrovascular   . Vertebrobasilar artery insufficiency   . Vertigo     Assessment/Plan: 1 Day Post-Op Procedure(s) (LRB): TOTAL HIP ARTHROPLASTY ANTERIOR APPROACH (Left) Principal Problem:   OA (osteoarthritis) of hip  Estimated body mass index is 25.35 kg/m as calculated from the following:   Height as of this encounter: 5\' 4"  (1.626 m).   Weight as of this encounter: 67 kg. Advance diet Up with therapy  DVT Prophylaxis - Aspirin Weight bearing as tolerated. D/C O2 and pulse ox and try on room air. Hemovac pulled without difficulty, will continue therapy.  BP soft this AM, 250 mL bolus ordered. Plan is to go Home with HEP after hospital stay. Possible discharge this afternoon if pain improves and meeting goals with therapy, otherwise will stay until tomorrow. Follow-up in the office in 2 weeks.  Arther Abbott, PA-C Orthopedic Surgery 10/04/2018, 6:59 AM

## 2018-10-04 NOTE — Progress Notes (Signed)
Physical Therapy Treatment Patient Details Name: Karen Woods MRN: [id number] DOB: Jul 14, 1946 Today's Date: 10/04/2018    History of Present Illness L DATHA, extensive back surgery    PT Comments    POD # 1 am session Assisted OOB to amb to bathroom, then in hallway.  Performed some TE's following HEP handout.  Instructed on proper tech, freq as well as use of ICE.   Pt will need a second PT session this afternoon to address stairs.   Follow Up Recommendations  Follow surgeon's recommendation for DC plan and follow-up therapies     Equipment Recommendations  None recommended by PT    Recommendations for Other Services       Precautions / Restrictions Precautions Precautions: Fall Restrictions Weight Bearing Restrictions: No    Mobility  Bed Mobility Overal bed mobility: Needs Assistance Bed Mobility: Supine to Sit     Supine to sit: Supervision;Min guard     General bed mobility comments: required increased, increased time   Transfers Overall transfer level: Needs assistance Equipment used: Rolling walker (2 wheeled) Transfers: Sit to/from Stand Sit to Stand: Supervision;Min guard         General transfer comment: 25% VC's on safety with turns and hand placement with stand to sit  Ambulation/Gait Ambulation/Gait assistance: Min guard;Supervision Gait Distance (Feet): 65 Feet Assistive device: Rolling walker (2 wheeled) Gait Pattern/deviations: Step-to pattern;Step-through pattern Gait velocity: decreased    General Gait Details: 25% VC's on proper walker to self distance and safety with turns.     Stairs             Wheelchair Mobility    Modified Rankin (Stroke Patients Only)       Balance                                            Cognition Arousal/Alertness: Awake/alert Behavior During Therapy: WFL for tasks assessed/performed Overall Cognitive Status: Within Functional Limits for tasks assessed                                        Exercises   Total Hip Replacement TE's 10 reps ankle pumps 10 reps knee presses 10 reps heel slides 10 reps SAQ's 10 reps ABD Followed by ICE    General Comments        Pertinent Vitals/Pain Pain Assessment: 0-10 Pain Score: 8  Pain Location: left hip Pain Descriptors / Indicators: Discomfort;Grimacing;Operative site guarding Pain Intervention(s): Ice applied;Repositioned;Monitored during session    Home Living                      Prior Function            PT Goals (current goals can now be found in the care plan section) Progress towards PT goals: Progressing toward goals    Frequency    7X/week      PT Plan Current plan remains appropriate    Co-evaluation              AM-PAC PT "6 Clicks" Mobility   Outcome Measure  Help needed turning from your back to your side while in a flat bed without using bedrails?: A Little Help needed moving from lying on your back to sitting on the side of a  flat bed without using bedrails?: A Little Help needed moving to and from a bed to a chair (including a wheelchair)?: A Little Help needed standing up from a chair using your arms (e.g., wheelchair or bedside chair)?: A Little Help needed to walk in hospital room?: A Little Help needed climbing 3-5 steps with a railing? : A Lot 6 Click Score: 17    End of Session Equipment Utilized During Treatment: Gait belt Activity Tolerance: Patient tolerated treatment well Patient left: in chair;with call bell/phone within reach;with chair alarm set Nurse Communication: Mobility status PT Visit Diagnosis: Unsteadiness on feet (R26.81)     Time: [id number] PT Time Calculation (min) (ACUTE ONLY): 24 min  Charges:  $Gait Training: 8-22 mins $Therapeutic Exercise: 8-22 mins                     Felecia ShellingLori Brielyn Bosak  PTA Acute  Rehabilitation Services Pager      [phone] Office      [phone]

## 2018-10-04 NOTE — Care Management Note (Signed)
Case Management Note  Patient Details  Name: Karen Woods MRN: [id number] Date of Birth: Aug 10, 1946  Subjective/Objective:   Spoke with patient at bedside. Confirmed plan for OP PT, already arranged. Has RW and 3n1. [phone]                 Action/Plan:   Expected Discharge Date:  10/04/18               Expected Discharge Plan:  Home/Self Care  In-House Referral:  NA  Discharge planning Services  CM Consult  Post Acute Care Choice:  NA Choice offered to:  Patient  DME Arranged:  N/A DME Agency:  NA  HH Arranged:  NA HH Agency:  NA  Status of Service:  Completed, signed off  If discussed at Long Length of Stay Meetings, dates discussed:    Additional Comments:  Alexis Goodell, RN 10/04/2018, 9:25 AM

## 2018-10-04 NOTE — Progress Notes (Signed)
Physical Therapy Treatment Patient Details Name: Karen Woods MRN: [id number] DOB: 11-25-1945 Today's Date: 10/04/2018    History of Present Illness L DATHA, extensive back surgery    PT Comments    POD # 1 pm session Assisted with amb to bathroom, amb in hallway, practiced 3 short steps then returned to room to Performed some TE's following HEP handout.  Instructed on proper tech, freq as well as use of ICE.   Addressed all mobility questions, discussed appropriate activity, educated on use of ICE.  Pt ready for D/C to home.   Follow Up Recommendations  Follow surgeon's recommendation for DC plan and follow-up therapies     Equipment Recommendations  None recommended by PT    Recommendations for Other Services       Precautions / Restrictions Precautions Precautions: Fall Restrictions Weight Bearing Restrictions: No    Mobility  Bed Mobility Overal bed mobility: Needs Assistance Bed Mobility: Supine to Sit     Supine to sit: Supervision;Min guard     General bed mobility comments: required increased, increased time   Transfers Overall transfer level: Needs assistance Equipment used: Rolling walker (2 wheeled) Transfers: Sit to/from Stand Sit to Stand: Supervision;Min guard         General transfer comment: 25% VC's on safety with turns and hand placement with stand to sit  Ambulation/Gait Ambulation/Gait assistance: Min guard;Supervision Gait Distance (Feet): 65 Feet Assistive device: Rolling walker (2 wheeled) Gait Pattern/deviations: Step-to pattern;Step-through pattern Gait velocity: decreased    General Gait Details: 25% VC's on proper walker to self distance and safety with turns.     Stairs Stairs: Yes Stairs assistance: Supervision;Min guard Stair Management: No rails;Step to pattern;Forwards;With walker Number of Stairs: 3 General stair comments: practiced 3 short steps forward with walker < 25 VC's on proper tech and safety.      Wheelchair Mobility    Modified Rankin (Stroke Patients Only)       Balance                                            Cognition Arousal/Alertness: Awake/alert Behavior During Therapy: WFL for tasks assessed/performed Overall Cognitive Status: Within Functional Limits for tasks assessed                                        Exercises  10 reps all standing TE's     General Comments        Pertinent Vitals/Pain Pain Assessment: 0-10 Pain Score: 8  Pain Location: left hip Pain Descriptors / Indicators: Discomfort;Grimacing;Operative site guarding Pain Intervention(s): Ice applied;Repositioned;Monitored during session    Home Living                      Prior Function            PT Goals (current goals can now be found in the care plan section) Progress towards PT goals: Progressing toward goals    Frequency    7X/week      PT Plan Current plan remains appropriate    Co-evaluation              AM-PAC PT "6 Clicks" Mobility   Outcome Measure  Help needed turning from your back to your side while in a  flat bed without using bedrails?: A Little Help needed moving from lying on your back to sitting on the side of a flat bed without using bedrails?: A Little Help needed moving to and from a bed to a chair (including a wheelchair)?: A Little Help needed standing up from a chair using your arms (e.g., wheelchair or bedside chair)?: A Little Help needed to walk in hospital room?: A Little Help needed climbing 3-5 steps with a railing? : A Lot 6 Click Score: 17    End of Session Equipment Utilized During Treatment: Gait belt Activity Tolerance: Patient tolerated treatment well Patient left: in chair;with call bell/phone within reach;with chair alarm set Nurse Communication: Mobility status PT Visit Diagnosis: Unsteadiness on feet (R26.81)     Time: 7893-8101 PT Time Calculation (min) (ACUTE ONLY): 35  min  Charges:  $Gait Training: 8-22 mins $Therapeutic Exercise: 8-22 mins                     Felecia Shelling  PTA Acute  Rehabilitation Services Pager      [phone] Office      [phone]

## 2018-10-05 NOTE — Discharge Summary (Signed)
Physician Discharge Summary   Patient ID: Karen Woods MRN: [id number] DOB/AGE: [birth year] 73 y.o.  Admit date: 10/03/2018 Discharge date: 10/04/2018  Primary Diagnosis: Osteoarthritis, left hip   Admission Diagnoses:  Past Medical History:  Diagnosis Date  . Arthritis   . Back pain   . COPD (chronic obstructive pulmonary disease) (HCC)    Mild  . Coronary artery disease   . Depression   . GERD (gastroesophageal reflux disease)   . Heart murmur   . History of aortic stenosis    Severe  . History of prolapse of bladder   . Hyperlipidemia   . Hypertension   . Osteoporosis   . Pneumonia   . PONV (postoperative nausea and vomiting)   . Pre-diabetes   . Small vessel disease, cerebrovascular   . Vertebrobasilar artery insufficiency   . Vertigo    Discharge Diagnoses:   Principal Problem:   OA (osteoarthritis) of hip  Estimated body mass index is 25.35 kg/m as calculated from the following:   Height as of this encounter: 5\' 4"  (1.626 m).   Weight as of this encounter: 67 kg.  Procedure:  Procedure(s) (LRB): TOTAL HIP ARTHROPLASTY ANTERIOR APPROACH (Left)   Consults: None  HPI: Karen Woods is a 73 y.o. female who has advanced end-stage arthritis of their Left  hip with progressively worsening pain and dysfunction.The patient has failed nonoperative management and presents for total hip arthroplasty.   Laboratory Data: Admission on 10/03/2018, Discharged on 10/04/2018  Component Date Value Ref Range Status  . WBC 10/04/2018 11.4* 4.0 - 10.5 K/uL Final  . RBC 10/04/2018 3.52* 3.87 - 5.11 MIL/uL Final  . Hemoglobin 10/04/2018 9.8* 12.0 - 15.0 g/dL Final  . HCT 40/98/119101/14/2020 32.1* 36.0 - 46.0 % Final  . MCV 10/04/2018 91.2  80.0 - 100.0 fL Final  . MCH 10/04/2018 27.8  26.0 - 34.0 pg Final  . MCHC 10/04/2018 30.5  30.0 - 36.0 g/dL Final  . RDW 47/82/956201/14/2020 14.2  11.5 - 15.5 % Final  . Platelets 10/04/2018 267  150 - 400 K/uL Final  . nRBC 10/04/2018 0.0  0.0 - 0.2 % Final    Performed at [facility], [address] [place], [place] [address]  . Sodium 10/04/2018 139  135 - 145 mmol/L Final  . Potassium 10/04/2018 4.4  3.5 - 5.1 mmol/L Final  . Chloride 10/04/2018 107  98 - 111 mmol/L Final  . CO2 10/04/2018 23  22 - 32 mmol/L Final  . Glucose, Bld 10/04/2018 111* 70 - 99 mg/dL Final  . BUN 57/84/696201/14/2020 8  8 - 23 mg/dL Final  . Creatinine, Ser 10/04/2018 0.56  0.44 - 1.00 mg/dL Final  . Calcium 95/28/413201/14/2020 8.6* 8.9 - 10.3 mg/dL Final  . GFR calc non Af Amer 10/04/2018 >60  >60 mL/min Final  . GFR calc Af Amer 10/04/2018 >60  >60 mL/min Final  . Anion gap 10/04/2018 9  5 - 15 Final   Performed at [facility], [address] [place], [place] [address]  Hospital Outpatient Visit on 09/28/2018  Component Date Value Ref Range Status  . aPTT 09/28/2018 29  24 - 36 seconds Final   Performed at [facility], [address] [place], [place] [address]  . WBC 09/28/2018 7.7  4.0 - 10.5 K/uL Final  . RBC 09/28/2018 4.17  3.87 - 5.11 MIL/uL Final  . Hemoglobin 09/28/2018 11.7* 12.0 - 15.0 g/dL Final  . HCT 66/44/034701/04/2019 38.5  36.0 -  46.0 % Final  . MCV 09/28/2018 92.3  80.0 - 100.0 fL Final  . MCH 09/28/2018 28.1  26.0 - 34.0 pg Final  . MCHC 09/28/2018 30.4  30.0 - 36.0 g/dL Final  . RDW 86/57/8469 14.3  11.5 - 15.5 % Final  . Platelets 09/28/2018 305  150 - 400 K/uL Final  . nRBC 09/28/2018 0.0  0.0 - 0.2 % Final   Performed at [facility], [address] [place], [place] [address]  . Sodium 09/28/2018 142  135 - 145 mmol/L Final  . Potassium 09/28/2018 4.0  3.5 - 5.1 mmol/L Final  . Chloride 09/28/2018 107  98 - 111 mmol/L Final  . CO2 09/28/2018 26  22 - 32 mmol/L Final  . Glucose, Bld 09/28/2018 115* 70 - 99 mg/dL Final  . BUN 84/13/2440 14  8 - 23 mg/dL Final  . Creatinine, Ser 09/28/2018 0.67  0.44 - 1.00 mg/dL Final  . Calcium 07/18/2535 9.2  8.9 - 10.3 mg/dL Final  . Total  Protein 09/28/2018 7.3  6.5 - 8.1 g/dL Final  . Albumin 64/40/3474 4.2  3.5 - 5.0 g/dL Final  . AST 25/95/6387 19  15 - 41 U/L Final  . ALT 09/28/2018 16  0 - 44 U/L Final  . Alkaline Phosphatase 09/28/2018 67  38 - 126 U/L Final  . Total Bilirubin 09/28/2018 1.1  0.3 - 1.2 mg/dL Final  . GFR calc non Af Amer 09/28/2018 >60  >60 mL/min Final  . GFR calc Af Amer 09/28/2018 >60  >60 mL/min Final  . Anion gap 09/28/2018 9  5 - 15 Final   Performed at [facility], [address], [place], [place] [address]  . Prothrombin Time 09/28/2018 12.6  11.4 - 15.2 seconds Final  . INR 09/28/2018 0.95   Final   Performed at [facility], [address], [place], [place] [address]  . ABO/RH(D) 09/28/2018 A POS   Final  . Antibody Screen 09/28/2018 NEG   Final  . Sample Expiration 09/28/2018 10/06/2018   Final  . Extend sample reason 09/28/2018    Final                   Value:NO TRANSFUSIONS OR PREGNANCY IN THE PAST 3 MONTHS Performed at [facility], [address] [place], [place] [address]   . MRSA, PCR 09/28/2018 NEGATIVE  NEGATIVE Final  . Staphylococcus aureus 09/28/2018 NEGATIVE  NEGATIVE Final   Comment: (NOTE) The Xpert SA Assay (FDA approved for NASAL specimens in patients 43 years of age and older), is one component of a comprehensive surveillance program. It is not intended to diagnose infection nor to guide or monitor treatment. Performed at [facility], [address], [place], [place] [address]   . Hgb A1c MFr Bld 09/28/2018 6.0* 4.8 - 5.6 % Final   Comment: (NOTE) Pre diabetes:          5.7%-6.4% Diabetes:              >6.4% Glycemic control for   <7.0% adults with diabetes   . Mean Plasma Glucose 09/28/2018 125.5  mg/dL Final   Performed at [facility], [address] [place], [place] [address]  . ABO/RH(D) 09/28/2018    Final                   Value:A POS Performed at [facility], [address] [place],  [place] [address]      X-Rays:Dg Pelvis Portable  Result Date: 10/03/2018 CLINICAL DATA:  Status post left hip replacement today. EXAM: PORTABLE PELVIS 1-2 VIEWS COMPARISON:  Intraoperative imaging today. FINDINGS: Left total hip arthroplasty is in place. There is some gas in the soft tissues and a surgical drain. The device is located. No fracture. Right hip osteoarthritis and postoperative change lower lumbar spine noted. IMPRESSION: Status post left hip replacement.  No acute abnormality. Electronically Signed   By: Drusilla Kanner M.D.   On: 10/03/2018 12:11   Dg C-arm 1-60 Min-no Report  Result Date: 10/03/2018 Fluoroscopy was utilized by the requesting physician.  No radiographic interpretation.   Dg Hip Operative Unilat With Pelvis Left  Result Date: 10/03/2018 CLINICAL DATA:  Left hip replacement EXAM: OPERATIVE LEFT HIP (WITH PELVIS IF PERFORMED) 5 VIEWS TECHNIQUE: Fluoroscopic spot image(s) were submitted for interpretation post-operatively. COMPARISON:  None FLUOROSCOPY TIME:  18 seconds FINDINGS: Interval left total hip arthroplasty.  Normal alignment. IMPRESSION: Interval left total hip arthroplasty. Electronically Signed   By: Elige Ko   On: 10/03/2018 11:40    EKG: Orders placed or performed during the hospital encounter of 05/16/17  . EKG 12-Lead  . EKG 12-Lead  . ED EKG  . ED EKG  . EKG     Hospital Course: Karen Woods is a 73 y.o. who was admitted to [facility]. They were brought to the operating room on 10/03/2018 and underwent Procedure(s): TOTAL HIP ARTHROPLASTY ANTERIOR APPROACH.  Patient tolerated the procedure well and was later transferred to the recovery room and then to the orthopaedic floor for postoperative care. They were given PO and IV analgesics for pain control following their surgery. They were given 24 hours of postoperative antibiotics of  Anti-infectives (From admission, onward)   Start      Dose/Rate Route Frequency Ordered Stop   10/03/18 1600  ceFAZolin (ANCEF) IVPB 2g/100 mL premix     2 g 200 mL/hr over 30 Minutes Intravenous Every 6 hours 10/03/18 1237 10/03/18 2326   10/03/18 0800  ceFAZolin (ANCEF) IVPB 2g/100 mL premix     2 g 200 mL/hr over 30 Minutes Intravenous On call to O.R. 10/03/18 6045 10/03/18 0941     and started on DVT prophylaxis in the form of Aspirin.   PT and OT were ordered for total joint protocol. Discharge planning consulted to help with postop disposition and equipment needs.  Patient had a decent night on the evening of surgery. They started to get up OOB with therapy on POD #0. Pt was seen during rounds and was ready to go home pending progress with therapy. BP was soft that AM, so a 250 mL bolus was ordered. Hemovac drain was pulled without difficulty. She worked with therapy on POD #1 and was meeting her goals. Pt was discharged to home later that day in stable condition.  Diet: Cardiac diet Activity: WBAT Follow-up: in 2 weeks with Dr. Lequita Halt Disposition: Home with HEP Discharged Condition: stable   Discharge Instructions    Call MD / Call 911   Complete by:  As directed    If you experience chest pain or shortness of breath, CALL 911 and be transported to the hospital emergency room.  If you develope a fever above 101 F, pus (white drainage) or increased drainage or redness at the wound, or calf pain, call your surgeon's office.   Change dressing   Complete by:  As directed    You may  change your dressing on Wednesday, then change the dressing daily with sterile 4 x 4 inch gauze dressing and paper tape.   Constipation Prevention   Complete by:  As directed    Drink plenty of fluids.  Prune juice may be helpful.  You may use a stool softener, such as Colace (over the counter) 100 mg twice a day.  Use MiraLax (over the counter) for constipation as needed.   Diet - low sodium heart healthy   Complete by:  As directed    Discharge instructions    Complete by:  As directed    Dr. Ollen Gross Total Joint Specialist [facility] [address], [address] [place], [place] [phone]  ANTERIOR APPROACH TOTAL HIP REPLACEMENT POSTOPERATIVE DIRECTIONS   Hip Rehabilitation, Guidelines Following Surgery  The results of a hip operation are greatly improved after range of motion and muscle strengthening exercises. Follow all safety measures which are given to protect your hip. If any of these exercises cause increased pain or swelling in your joint, decrease the amount until you are comfortable again. Then slowly increase the exercises. Call your caregiver if you have problems or questions.   HOME CARE INSTRUCTIONS  Remove items at home which could result in a fall. This includes throw rugs or furniture in walking pathways.  ICE to the affected hip every three hours for 30 minutes at a time and then as needed for pain and swelling.  Continue to use ice on the hip for pain and swelling from surgery. You may notice swelling that will progress down to the foot and ankle.  This is normal after surgery.  Elevate the leg when you are not up walking on it.   Continue to use the breathing machine which will help keep your temperature down.  It is common for your temperature to cycle up and down following surgery, especially at night when you are not up moving around and exerting yourself.  The breathing machine keeps your lungs expanded and your temperature down.  DIET You may resume your previous home diet once your are discharged from the hospital.  DRESSING / WOUND CARE / SHOWERING You may shower 3 days after surgery, but keep the wounds dry during showering.  You may use an occlusive plastic wrap (Press'n Seal for example), NO SOAKING/SUBMERGING IN THE BATHTUB.  If the bandage gets wet, change with a clean dry gauze.  If the incision gets wet, pat the wound dry with a clean towel. You may start showering once you are discharged home  but do not submerge the incision under water. Just pat the incision dry and apply a dry gauze dressing on daily. Change the surgical dressing daily and reapply a dry dressing each time.  ACTIVITY Walk with your walker as instructed. Use walker as long as suggested by your caregivers. Avoid periods of inactivity such as sitting longer than an hour when not asleep. This helps prevent blood clots.  You may resume a sexual relationship in one month or when given the OK by your doctor.  You may return to work once you are cleared by your doctor.  Do not drive a car for 6 weeks or until released by you surgeon.  Do not drive while taking narcotics.  WEIGHT BEARING Weight bearing as tolerated with assist device (walker, cane, etc) as directed, use it as long as suggested by your surgeon or therapist, typically at least 4-6 weeks.  POSTOPERATIVE CONSTIPATION PROTOCOL Constipation - defined medically as  fewer than three stools per week and severe constipation as less than one stool per week.  One of the most common issues patients have following surgery is constipation.  Even if you have a regular bowel pattern at home, your normal regimen is likely to be disrupted due to multiple reasons following surgery.  Combination of anesthesia, postoperative narcotics, change in appetite and fluid intake all can affect your bowels.  In order to avoid complications following surgery, here are some recommendations in order to help you during your recovery period.  Colace (docusate) - Pick up an over-the-counter form of Colace or another stool softener and take twice a day as long as you are requiring postoperative pain medications.  Take with a full glass of water daily.  If you experience loose stools or diarrhea, hold the colace until you stool forms back up.  If your symptoms do not get better within 1 week or if they get worse, check with your doctor.  Dulcolax (bisacodyl) - Pick up over-the-counter and take as  directed by the product packaging as needed to assist with the movement of your bowels.  Take with a full glass of water.  Use this product as needed if not relieved by Colace only.   MiraLax (polyethylene glycol) - Pick up over-the-counter to have on hand.  MiraLax is a solution that will increase the amount of water in your bowels to assist with bowel movements.  Take as directed and can mix with a glass of water, juice, soda, coffee, or tea.  Take if you go more than two days without a movement. Do not use MiraLax more than once per day. Call your doctor if you are still constipated or irregular after using this medication for 7 days in a row.  If you continue to have problems with postoperative constipation, please contact the office for further assistance and recommendations.  If you experience "the worst abdominal pain ever" or develop nausea or vomiting, please contact the office immediatly for further recommendations for treatment.  ITCHING  If you experience itching with your medications, try taking only a single pain pill, or even half a pain pill at a time.  You can also use Benadryl over the counter for itching or also to help with sleep.   TED HOSE STOCKINGS Wear the elastic stockings on both legs for three weeks following surgery during the day but you may remove then at night for sleeping.  MEDICATIONS See your medication summary on the "After Visit Summary" that the nursing staff will review with you prior to discharge.  You may have some home medications which will be placed on hold until you complete the course of blood thinner medication.  It is important for you to complete the blood thinner medication as prescribed by your surgeon.  Continue your approved medications as instructed at time of discharge.  PRECAUTIONS If you experience chest pain or shortness of breath - call 911 immediately for transfer to the hospital emergency department.  If you develop a fever greater that  101 F, purulent drainage from wound, increased redness or drainage from wound, foul odor from the wound/dressing, or calf pain - CONTACT YOUR SURGEON.                                                   FOLLOW-UP APPOINTMENTS Make sure  you keep all of your appointments after your operation with your surgeon and caregivers. You should call the office at the above phone number and make an appointment for approximately two weeks after the date of your surgery or on the date instructed by your surgeon outlined in the "After Visit Summary".  RANGE OF MOTION AND STRENGTHENING EXERCISES  These exercises are designed to help you keep full movement of your hip joint. Follow your caregiver's or physical therapist's instructions. Perform all exercises about fifteen times, three times per day or as directed. Exercise both hips, even if you have had only one joint replacement. These exercises can be done on a training (exercise) mat, on the floor, on a table or on a bed. Use whatever works the best and is most comfortable for you. Use music or television while you are exercising so that the exercises are a pleasant break in your day. This will make your life better with the exercises acting as a break in routine you can look forward to.  Lying on your back, slowly slide your foot toward your buttocks, raising your knee up off the floor. Then slowly slide your foot back down until your leg is straight again.  Lying on your back spread your legs as far apart as you can without causing discomfort.  Lying on your side, raise your upper leg and foot straight up from the floor as far as is comfortable. Slowly lower the leg and repeat.  Lying on your back, tighten up the muscle in the front of your thigh (quadriceps muscles). You can do this by keeping your leg straight and trying to raise your heel off the floor. This helps strengthen the largest muscle supporting your knee.  Lying on your back, tighten up the muscles of your  buttocks both with the legs straight and with the knee bent at a comfortable angle while keeping your heel on the floor.   IF YOU ARE TRANSFERRED TO A SKILLED REHAB FACILITY If the patient is transferred to a skilled rehab facility following release from the hospital, a list of the current medications will be sent to the facility for the patient to continue.  When discharged from the skilled rehab facility, please have the facility set up the patient's Home Health Physical Therapy prior to being released. Also, the skilled facility will be responsible for providing the patient with their medications at time of release from the facility to include their pain medication, the muscle relaxants, and their blood thinner medication. If the patient is still at the rehab facility at time of the two week follow up appointment, the skilled rehab facility will also need to assist the patient in arranging follow up appointment in our office and any transportation needs.  MAKE SURE YOU:  Understand these instructions.  Get help right away if you are not doing well or get worse.    Pick up stool softner and laxative for home use following surgery while on pain medications. Do not submerge incision under water. Please use good hand washing techniques while changing dressing each day. May shower starting three days after surgery. Please use a clean towel to pat the incision dry following showers. Continue to use ice for pain and swelling after surgery. Do not use any lotions or creams on the incision until instructed by your surgeon.   Do not sit on low chairs, stoools or toilet seats, as it may be difficult to get up from low surfaces   Complete by:  As directed    Driving restrictions   Complete by:  As directed    No driving for two weeks   TED hose   Complete by:  As directed    Use stockings (TED hose) for three weeks on both leg(s).  You may remove them at night for sleeping.   Weight bearing as  tolerated   Complete by:  As directed      Allergies as of 10/04/2018      Reactions   Morphine And Related Nausea And Vomiting   Codeine Rash      Medication List    TAKE these medications   albuterol (5 MG/ML) 0.5% nebulizer solution Commonly known as:  PROVENTIL Take 2.5 mg by nebulization every 6 (six) hours as needed for wheezing or shortness of breath.   aspirin 325 MG EC tablet Take 1 tablet (325 mg total) by mouth 2 (two) times daily for 20 days. Then resume one 81 mg aspirin once a day. What changed:    medication strength  how much to take  when to take this  additional instructions   BREO ELLIPTA 100-25 MCG/INH Aepb Generic drug:  fluticasone furoate-vilanterol Inhale 1 puff into the lungs daily as needed (asthma).   estradiol 0.5 MG tablet Commonly known as:  ESTRACE Take 0.5 mg by mouth daily.   gabapentin 300 MG capsule Commonly known as:  NEURONTIN Take 600-900 mg by mouth See admin instructions. Pt to take 600 mg twice daily, and 900 mg at bedtime   GLUCOSAMINE CHONDR 1500 COMPLX PO Take 1 tablet by mouth daily.   HYDROcodone-acetaminophen 5-325 MG tablet Commonly known as:  NORCO/VICODIN Take 1-2 tablets by mouth every 6 (six) hours as needed for severe pain. What changed:    how much to take  reasons to take this   methocarbamol 500 MG tablet Commonly known as:  ROBAXIN Take 1 tablet (500 mg total) by mouth every 6 (six) hours as needed for muscle spasms.   metoprolol tartrate 25 MG tablet Commonly known as:  LOPRESSOR Take 25 mg by mouth 2 (two) times daily.   multivitamin with minerals tablet Take 1 tablet by mouth daily.   omeprazole 40 MG capsule Commonly known as:  PRILOSEC Take 40 mg by mouth daily.   rosuvastatin 40 MG tablet Commonly known as:  CRESTOR Take 40 mg by mouth daily.   traMADol 50 MG tablet Commonly known as:  ULTRAM Take 1-2 tablets (50-100 mg total) by mouth every 6 (six) hours as needed for moderate  pain.            Discharge Care Instructions  (From admission, onward)         Start     Ordered   10/04/18 0000  Weight bearing as tolerated     10/04/18 0705   10/04/18 0000  Change dressing    Comments:  You may change your dressing on Wednesday, then change the dressing daily with sterile 4 x 4 inch gauze dressing and paper tape.   10/04/18 0705         Follow-up Information    Ollen GrossAluisio, Frank, MD. Schedule an appointment as soon as possible for a visit on 10/18/2018.   Specialty:  Orthopedic Surgery Contact information: [address] [place] [address] [phone]           Signed: Arther AbbottKristie Darneshia Demary, PA-C Orthopedic Surgery 10/05/2018, 9:10 AM

## 2018-10-10 ENCOUNTER — Encounter (HOSPITAL_COMMUNITY): Payer: Self-pay

## 2018-10-10 ENCOUNTER — Emergency Department (HOSPITAL_COMMUNITY)
Admission: EM | Admit: 2018-10-10 | Discharge: 2018-10-10 | Disposition: A | Discharge status: Home / Self Care | Payer: Medicare Other | Attending: Emergency Medicine | Admitting: Emergency Medicine

## 2018-10-10 ENCOUNTER — Other Ambulatory Visit: Payer: Self-pay

## 2018-10-10 ENCOUNTER — Emergency Department (HOSPITAL_COMMUNITY): Payer: Medicare Other

## 2018-10-10 DIAGNOSIS — Z952 Presence of prosthetic heart valve: Secondary | ICD-10-CM | POA: Diagnosis not present

## 2018-10-10 DIAGNOSIS — I2510 Atherosclerotic heart disease of native coronary artery without angina pectoris: Secondary | ICD-10-CM | POA: Insufficient documentation

## 2018-10-10 DIAGNOSIS — J449 Chronic obstructive pulmonary disease, unspecified: Secondary | ICD-10-CM | POA: Diagnosis not present

## 2018-10-10 DIAGNOSIS — Z7982 Long term (current) use of aspirin: Secondary | ICD-10-CM | POA: Diagnosis not present

## 2018-10-10 DIAGNOSIS — I10 Essential (primary) hypertension: Secondary | ICD-10-CM | POA: Insufficient documentation

## 2018-10-10 DIAGNOSIS — M25552 Pain in left hip: Secondary | ICD-10-CM | POA: Diagnosis present

## 2018-10-10 DIAGNOSIS — Z79899 Other long term (current) drug therapy: Secondary | ICD-10-CM | POA: Insufficient documentation

## 2018-10-10 DIAGNOSIS — Z87891 Personal history of nicotine dependence: Secondary | ICD-10-CM | POA: Diagnosis not present

## 2018-10-10 DIAGNOSIS — Z96642 Presence of left artificial hip joint: Secondary | ICD-10-CM | POA: Insufficient documentation

## 2018-10-10 DIAGNOSIS — F329 Major depressive disorder, single episode, unspecified: Secondary | ICD-10-CM | POA: Diagnosis not present

## 2018-10-10 LAB — BASIC METABOLIC PANEL
Anion gap: 10 (ref 5–15)
BUN: 11 mg/dL (ref 8–23)
CHLORIDE: 104 mmol/L (ref 98–111)
CO2: 26 mmol/L (ref 22–32)
Calcium: 9.6 mg/dL (ref 8.9–10.3)
Creatinine, Ser: 0.68 mg/dL (ref 0.44–1.00)
GFR calc Af Amer: >60 mL/min (ref >60)
GFR calc non Af Amer: >60 mL/min (ref >60)
Glucose, Bld: 98 mg/dL (ref 70–99)
Potassium: 4.1 mmol/L (ref 3.5–5.1)
SODIUM: 140 mmol/L (ref 135–145)

## 2018-10-10 LAB — CBC WITH DIFFERENTIAL/PLATELET
Abs Immature Granulocytes: 0.07 10*3/uL (ref 0.00–0.07)
Basophils Absolute: 0 10*3/uL (ref 0.0–0.1)
Basophils Relative: 1 %
Eosinophils Absolute: 0.2 10*3/uL (ref 0.0–0.5)
Eosinophils Relative: 3 %
HCT: 34.5 % — ABNORMAL LOW (ref 36.0–46.0)
HEMOGLOBIN: 10.4 g/dL — AB (ref 12.0–15.0)
Immature Granulocytes: 1 %
Lymphocytes Relative: 14 %
Lymphs Abs: 1 10*3/uL (ref 0.7–4.0)
MCH: 28 pg (ref 26.0–34.0)
MCHC: 30.1 g/dL (ref 30.0–36.0)
MCV: 92.7 fL (ref 80.0–100.0)
Monocytes Absolute: 0.7 10*3/uL (ref 0.1–1.0)
Monocytes Relative: 10 %
Neutro Abs: 5 10*3/uL (ref 1.7–7.7)
Neutrophils Relative %: 71 %
Platelets: 365 10*3/uL (ref 150–400)
RBC: 3.72 MIL/uL — ABNORMAL LOW (ref 3.87–5.11)
RDW: 14.5 % (ref 11.5–15.5)
WBC: 7.1 10*3/uL (ref 4.0–10.5)
nRBC: 0 % (ref 0.0–0.2)

## 2018-10-10 MED ORDER — ONDANSETRON HCL 4 MG/2ML IJ SOLN
4.0000 mg | Freq: Once | INTRAMUSCULAR | Status: AC
  Administered 2018-10-10: 4 mg via INTRAVENOUS
  Filled 2018-10-10: qty 2

## 2018-10-10 MED ORDER — OXYCODONE-ACETAMINOPHEN 5-325 MG PO TABS: 1.0000 | ORAL_TABLET | Freq: Four times a day (QID) | ORAL | 0 refills | Status: DC | PRN

## 2018-10-10 MED ORDER — HYDROMORPHONE HCL 1 MG/ML IJ SOLN
1.0000 mg | Freq: Once | INTRAMUSCULAR | Status: AC
  Administered 2018-10-10: 1 mg via INTRAVENOUS
  Filled 2018-10-10: qty 1

## 2018-10-10 NOTE — ED Provider Notes (Signed)
[facility] Note   CSN: [id number] Arrival date & time: 10/10/18  1422     History   Chief Complaint Chief Complaint  Patient presents with  . Hip Pain    surgery 1 week ago.    HPI Karen Woods is a 73 y.o. female.  Patient is a 73 year old female with past medical history of COPD, coronary artery disease, hypertension, and recent left total hip replacement.  This was done approximately 1 week ago.  She was healing well and improving until yesterday when she experienced additional pain.  The pain significantly worsened today to the point she is unable to move or walk.  She denies any fevers or chills.  She denies any injury or trauma.  The history is provided by the patient.  Hip Pain  This is a new problem. The current episode started yesterday. The problem occurs constantly. The problem has been rapidly worsening. The symptoms are aggravated by walking. Nothing relieves the symptoms. She has tried nothing for the symptoms.    Past Medical History:  Diagnosis Date  . Arthritis   . Back pain   . COPD (chronic obstructive pulmonary disease) (HCC)    Mild  . Coronary artery disease   . Depression   . GERD (gastroesophageal reflux disease)   . Heart murmur   . History of aortic stenosis    Severe  . History of prolapse of bladder   . Hyperlipidemia   . Hypertension   . Osteoporosis   . Pneumonia   . PONV (postoperative nausea and vomiting)   . Pre-diabetes   . Small vessel disease, cerebrovascular   . Vertebrobasilar artery insufficiency   . Vertigo     Patient Active Problem List   Diagnosis Date Noted  . OA (osteoarthritis) of hip 10/03/2018    Past Surgical History:  Procedure Laterality Date  . ABDOMINAL HYSTERECTOMY    . Ablation on neck    . AORTIC VALVE REPLACEMENT (AVR)/CORONARY ARTERY BYPASS GRAFTING (CABG)  10/19/2013  . BACK SURGERY  03/18/2015  . BLEPHAROPLASTY    . CARDIAC CATHETERIZATION    .  COLONOSCOPY  12/09/2007  . SACROSPINOUS LIGAMENT FIXATION  06/25/2016  . TOTAL HIP ARTHROPLASTY Left 10/03/2018   Procedure: TOTAL HIP ARTHROPLASTY ANTERIOR APPROACH;  Surgeon: Ollen GrossAluisio, Frank, MD;  Location: [facility];  Service: Orthopedics;  Laterality: Left;  100min     OB History   No obstetric history on file.      Home Medications    Prior to Admission medications   Medication Sig Start Date End Date Taking? Authorizing Provider  albuterol (PROVENTIL) (5 MG/ML) 0.5% nebulizer solution Take 2.5 mg by nebulization every 6 (six) hours as needed for wheezing or shortness of breath.    [provider]  aspirin EC 325 MG EC tablet Take 1 tablet (325 mg total) by mouth 2 (two) times daily for 20 days. Then resume one 81 mg aspirin once a day. 10/04/18 10/24/18  Edmisten, Lyn HollingsheadKristie L, PA  estradiol (ESTRACE) 0.5 MG tablet Take 0.5 mg by mouth daily.    [provider]  fluticasone furoate-vilanterol (BREO ELLIPTA) 100-25 MCG/INH AEPB Inhale 1 puff into the lungs daily as needed (asthma).    [provider]  gabapentin (NEURONTIN) 300 MG capsule Take 600-900 mg by mouth See admin instructions. Pt to take 600 mg twice daily, and 900 mg at bedtime    [provider]  Glucosamine-Chondroit-Vit C-Mn (GLUCOSAMINE CHONDR 1500 COMPLX PO) Take 1  tablet by mouth daily.    [provider]  HYDROcodone-acetaminophen (NORCO/VICODIN) 5-325 MG tablet Take 1-2 tablets by mouth every 6 (six) hours as needed for severe pain. 10/04/18   Edmisten, Lyn Hollingshead, PA  methocarbamol (ROBAXIN) 500 MG tablet Take 1 tablet (500 mg total) by mouth every 6 (six) hours as needed for muscle spasms. 10/04/18   Edmisten, Kristie L, PA  metoprolol tartrate (LOPRESSOR) 25 MG tablet Take 25 mg by mouth 2 (two) times daily.    [provider]  Multiple Vitamins-Minerals (MULTIVITAMIN WITH MINERALS) tablet Take 1 tablet by mouth daily.    [provider]  omeprazole (PRILOSEC) 40 MG  capsule Take 40 mg by mouth daily.    [provider]  rosuvastatin (CRESTOR) 40 MG tablet Take 40 mg by mouth daily.    [provider]  traMADol (ULTRAM) 50 MG tablet Take 1-2 tablets (50-100 mg total) by mouth every 6 (six) hours as needed for moderate pain. 10/04/18   Edmisten, Lyn Hollingshead, PA    Family History History reviewed. No pertinent family history.  Social History Social History   Tobacco Use  . Smoking status: Former Games developer  . Smokeless tobacco: Never Used  Substance Use Topics  . Alcohol use: Not Currently  . Drug use: Never     Allergies   Morphine and related and Codeine   Review of Systems Review of Systems  All other systems reviewed and are negative.    Physical Exam Updated Vital Signs BP (!) 114/97 (BP Location: Right Arm)   Pulse (!) 57   Temp 97.8 F (36.6 C) (Oral)   Resp 20   Ht 5\' 4"  (1.626 m)   Wt 67 kg   SpO2 100%   BMI 25.35 kg/m   Physical Exam Vitals signs and nursing note reviewed.  Constitutional:      General: She is not in acute distress.    Appearance: She is well-developed. She is not diaphoretic.  HENT:     Head: Normocephalic and atraumatic.  Neck:     Musculoskeletal: Normal range of motion and neck supple.  Cardiovascular:     Rate and Rhythm: Normal rate and regular rhythm.     Heart sounds: No murmur. No friction rub. No gallop.   Pulmonary:     Effort: Pulmonary effort is normal. No respiratory distress.     Breath sounds: Normal breath sounds. No wheezing.  Abdominal:     General: Bowel sounds are normal. There is no distension.     Palpations: Abdomen is soft.     Tenderness: There is no abdominal tenderness.  Musculoskeletal: Normal range of motion.     Comments: The left hip appears grossly normal.  The surgical incision has the Steri-Strips in place.  There is no purulent drainage, erythema, or tenderness to the soft tissues.  She has pain with any range of motion.  The distal left  extremity is neurovascularly intact with easily palpable DP pulses and motor and sensation intact throughout the foot.  Skin:    General: Skin is warm and dry.  Neurological:     Mental Status: She is alert and oriented to person, place, and time.      ED Treatments / Results  Labs (all labs ordered are listed, but only abnormal results are displayed) Labs Reviewed  BASIC METABOLIC PANEL  CBC WITH DIFFERENTIAL/PLATELET    EKG None  Radiology No results found.  Procedures Procedures (including critical care time)  Medications Ordered in  ED Medications  HYDROmorphone (DILAUDID) injection 1 mg (has no administration in time range)  ondansetron (ZOFRAN) injection 4 mg (has no administration in time range)     Initial Impression / Assessment and Plan / ED Course  I have reviewed the triage vital signs and the nursing notes.  Pertinent labs & imaging results that were available during my care of the patient were reviewed by me and considered in my medical decision making (see chart for details).  Patient with history of recent total hip replacement presenting with complaints of left hip pain that worsened over the past 2 days.  This began in the absence of any injury or trauma.  Her physical examination shows some pain with range of motion, however the incision appears clean, intact, without erythema or purulent drainage.  She does have pain with range of motion, but remainder of the leg is neurovascularly intact.  X-rays showed no evidence for hardware complication or other problem.  She has no fever, no white count, and no tachycardia that would be suspicious of infection.  This was discussed with Dr. Lequita Halt from orthopedics.  He is comfortable with the patient being discharged with additional pain medication.  She was given Dilaudid here in the ER and is now much more comfortable.  Final Clinical Impressions(s) / ED Diagnoses   Final diagnoses:  None    ED Discharge  Orders    None       Geoffery Lyons, MD 10/10/18 [phone]

## 2018-10-10 NOTE — ED Triage Notes (Signed)
Patient states she had left hip surgery a week ago and has had increased pain yesterday and worse today. Patient states she has called her surgeon,but never did get an answer.

## 2018-10-10 NOTE — Discharge Instructions (Addendum)
Stop taking hydrocodone.  Begin taking oxycodone as prescribed today.  Follow-up with Dr. Lequita Halt as scheduled, sooner if your condition worsens.

## 2019-03-03 ENCOUNTER — Other Ambulatory Visit: Payer: Self-pay

## 2019-03-03 ENCOUNTER — Emergency Department (HOSPITAL_COMMUNITY)
Admission: EM | Admit: 2019-03-03 | Discharge: 2019-03-03 | Disposition: A | Discharge status: Home / Self Care | Payer: Medicare Other | Attending: Emergency Medicine | Admitting: Emergency Medicine

## 2019-03-03 ENCOUNTER — Encounter (HOSPITAL_COMMUNITY): Payer: Self-pay

## 2019-03-03 ENCOUNTER — Emergency Department (HOSPITAL_COMMUNITY): Payer: Medicare Other

## 2019-03-03 DIAGNOSIS — J449 Chronic obstructive pulmonary disease, unspecified: Secondary | ICD-10-CM | POA: Diagnosis not present

## 2019-03-03 DIAGNOSIS — Z87891 Personal history of nicotine dependence: Secondary | ICD-10-CM | POA: Insufficient documentation

## 2019-03-03 DIAGNOSIS — I2510 Atherosclerotic heart disease of native coronary artery without angina pectoris: Secondary | ICD-10-CM | POA: Diagnosis not present

## 2019-03-03 DIAGNOSIS — I10 Essential (primary) hypertension: Secondary | ICD-10-CM | POA: Diagnosis not present

## 2019-03-03 DIAGNOSIS — Z79899 Other long term (current) drug therapy: Secondary | ICD-10-CM | POA: Insufficient documentation

## 2019-03-03 DIAGNOSIS — M25552 Pain in left hip: Secondary | ICD-10-CM | POA: Diagnosis not present

## 2019-03-03 DIAGNOSIS — Z96642 Presence of left artificial hip joint: Secondary | ICD-10-CM | POA: Diagnosis not present

## 2019-03-03 MED ORDER — HYDROCODONE-ACETAMINOPHEN 5-325 MG PO TABS
1.0000 | ORAL_TABLET | Freq: Once | ORAL | Status: AC
  Administered 2019-03-03: 1 via ORAL
  Filled 2019-03-03: qty 1

## 2019-03-03 NOTE — ED Triage Notes (Signed)
Per EMS,  Pt had a hip replacement in janurary. Was at a restaurant today, stood up heard/felt a pop. Pt sat down and was unable to further ambulate. No obvious deformity noted, pt still able to move toes, and good pedal pulses felt.

## 2019-03-03 NOTE — ED Provider Notes (Signed)
[facility] DEPT Provider Note   CSN: [id number] Arrival date & time: 03/03/19  1950    History   Chief Complaint Chief Complaint  Patient presents with  . Hip Injury    HPI Karen Woods is a 72 y.o. female with past medical history of COPD, CAD, osteoporosis, status post left hip replacement, presenting to the emergency department via EMS with a sudden onset of left hip pain.  Pain is located on the lateral aspect, that is worse with's certain movements and palpation.  She states she was leaving a restaurant today, when she stood up she heard/felt a popping sensation in the lateral aspect of her hip.  She states she has been unable to bear weight secondary to pain, however pain is absent while at rest.  Denies numbness or tingling to her extremities.  States she had good recovery after hip replacement, done by Dr. Wynelle Link in January of this year.  States she took a hydrocodone just prior to going to the restaurant this evening.  She is prescribed this chronically for her arthritis.     The history is provided by the patient.    Past Medical History:  Diagnosis Date  . Arthritis   . Back pain   . COPD (chronic obstructive pulmonary disease) (HCC)    Mild  . Coronary artery disease   . Depression   . GERD (gastroesophageal reflux disease)   . Heart murmur   . History of aortic stenosis    Severe  . History of prolapse of bladder   . Hyperlipidemia   . Hypertension   . Osteoporosis   . Pneumonia   . PONV (postoperative nausea and vomiting)   . Pre-diabetes   . Small vessel disease, cerebrovascular   . Vertebrobasilar artery insufficiency   . Vertigo     Patient Active Problem List   Diagnosis Date Noted  . OA (osteoarthritis) of hip 10/03/2018    Past Surgical History:  Procedure Laterality Date  . ABDOMINAL HYSTERECTOMY    . Ablation on neck    . AORTIC VALVE REPLACEMENT (AVR)/CORONARY ARTERY BYPASS GRAFTING (CABG)  10/19/2013  .  BACK SURGERY  03/18/2015  . BLEPHAROPLASTY    . CARDIAC CATHETERIZATION    . COLONOSCOPY  12/09/2007  . SACROSPINOUS LIGAMENT FIXATION  06/25/2016  . TOTAL HIP ARTHROPLASTY Left 10/03/2018   Procedure: TOTAL HIP ARTHROPLASTY ANTERIOR APPROACH;  Surgeon: Gaynelle Arabian, MD;  Location: [facility];  Service: Orthopedics;  Laterality: Left;  144min     OB History   No obstetric history on file.      Home Medications    Prior to Admission medications   Medication Sig Start Date End Date Taking? Authorizing Provider  albuterol (PROVENTIL) (5 MG/ML) 0.5% nebulizer solution Take 2.5 mg by nebulization every 6 (six) hours as needed for wheezing or shortness of breath.   Yes [provider]  estradiol (ESTRACE) 0.5 MG tablet Take 0.5 mg by mouth daily.   Yes [provider]  gabapentin (NEURONTIN) 800 MG tablet Take 800 mg by mouth 3 (three) times daily.    Yes [provider]  Glucosamine-Chondroit-Vit C-Mn (GLUCOSAMINE CHONDR 1500 COMPLX PO) Take 1 tablet by mouth daily.   Yes [provider]  HYDROcodone-acetaminophen (NORCO/VICODIN) 5-325 MG tablet Take 1-2 tablets by mouth every 6 (six) hours as needed for severe pain. 10/04/18  Yes Edmisten, Kristie L, PA  metoprolol tartrate (LOPRESSOR) 25 MG tablet Take 25 mg by mouth 2 (two) times daily.  Yes [provider]  Multiple Vitamins-Minerals (MULTIVITAMIN WITH MINERALS) tablet Take 1 tablet by mouth daily.   Yes [provider]  omeprazole (PRILOSEC) 40 MG capsule Take 40 mg by mouth daily.   Yes [provider]  rosuvastatin (CRESTOR) 40 MG tablet Take 40 mg by mouth daily.   Yes [provider]  methocarbamol (ROBAXIN) 500 MG tablet Take 1 tablet (500 mg total) by mouth every 6 (six) hours as needed for muscle spasms. Patient not taking: Reported on 03/03/2019 10/04/18   Edmisten, Danford BadKristie L, PA  oxyCODONE-acetaminophen (PERCOCET) 5-325 MG tablet Take 1-2 tablets by mouth every  6 (six) hours as needed. 10/10/18   Geoffery Lyonselo, Douglas, MD  traMADol (ULTRAM) 50 MG tablet Take 1-2 tablets (50-100 mg total) by mouth every 6 (six) hours as needed for moderate pain. 10/04/18   Edmisten, Lyn HollingsheadKristie L, PA    Family History No family history on file.  Social History Social History   Tobacco Use  . Smoking status: Former Games developermoker  . Smokeless tobacco: Never Used  Substance Use Topics  . Alcohol use: Not Currently  . Drug use: Never     Allergies   Sulfamethoxazole, Morphine and related, and Codeine   Review of Systems Review of Systems  Musculoskeletal: Positive for arthralgias.  All other systems reviewed and are negative.    Physical Exam Updated Vital Signs BP (!) 143/72   Pulse 60   Temp 97.9 F (36.6 C)   Resp 11   Ht 5\' 4"  (1.626 m)   Wt 68.9 kg   SpO2 99%   BMI 26.09 kg/m   Physical Exam Vitals signs and nursing note reviewed.  Constitutional:      General: She is not in acute distress.    Appearance: She is well-developed.  HENT:     Head: Normocephalic and atraumatic.  Eyes:     Conjunctiva/sclera: Conjunctivae normal.  Cardiovascular:     Rate and Rhythm: Normal rate and regular rhythm.  Pulmonary:     Effort: Pulmonary effort is normal. No respiratory distress.     Breath sounds: Normal breath sounds.  Abdominal:     Palpations: Abdomen is soft.  Musculoskeletal:     Comments: There is some tenderness to the lateral aspect of the left hip, and the region of the greater trochanter.  There is no deformity of the left lower extremity.  Normal range of motion including internal and external rotation of the hip, as well as flexion past 90 degrees.  These movements do not cause pain.  Patient is actively moving her leg around on exam.  Distal sensation and pulses are intact.  Normal strength dorsi and plantar flexion  Skin:    General: Skin is warm.  Neurological:     Mental Status: She is alert.  Psychiatric:        Behavior: Behavior normal.       ED Treatments / Results  Labs (all labs ordered are listed, but only abnormal results are displayed) Labs Reviewed - No data to display  EKG None  Radiology No results found.  Procedures Procedures (including critical care time)  Medications Ordered in ED Medications  HYDROcodone-acetaminophen (NORCO/VICODIN) 5-325 MG per tablet 1 tablet (has no administration in time range)     Initial Impression / Assessment and Plan / ED Course  I have reviewed the triage vital signs and the nursing notes.  Pertinent labs & imaging results that were available during my care of the patient were reviewed  by me and considered in my medical decision making (see chart for details).  Clinical Course as of Mar 02 2258  Fri Mar 03, 2019  2210 [facility] w radiology. Xray has been read but is not crossing over to EMR. Xray impression: No acute findings. Left hip total arthroplasty is well seated and aligned with no evidence of loosening.   DG Hip Unilat With Pelvis 2-3 Views Left [JR]    Clinical Course User Index [JR] Eriona Kinchen, [place] N, PA-C       Patient with pain and popping sensation to left hip upon standing just prior to arrival.  No pain at rest, however pain with weightbearing.  Neurovascular intact.  X-rays negative for acute fracture, dislocation, or problems with hardware.  It is possible that her hip dislocated and self-reduced. At this time, recommend patient use her walker at home to avoid weightbearing if she continues to have pain.  Instructed follow-up with her orthopedic surgeon.  Patient is prescribed hydrocodone chronically, she can continue take this as prescribed as needed for pain.  Pt discussed with Dr. Donnald GarrePfeiffer, who is agreeable with care plan.  Discussed results, findings, treatment and follow up. Patient advised of return precautions. Patient verbalized understanding and agreed with plan.  Final Clinical Impressions(s) / ED Diagnoses   Final diagnoses:  Pain of  left hip joint  History of hip replacement, total, left    ED Discharge Orders    None       Deo Mehringer, [place] N, PA-C 03/03/19 2259    Arby BarrettePfeiffer, Marcy, MD 03/04/19 1805

## 2019-03-03 NOTE — ED Notes (Signed)
Patient transported to X-ray 

## 2019-03-03 NOTE — ED Notes (Signed)
Bed: SW96 Expected date:  Expected time:  Means of arrival:  Comments: EMS  73 yo female from [place]-at a restaurant-felt hip pop-recent hip replacement here in January

## 2019-03-03 NOTE — Discharge Instructions (Addendum)
Use your walker at home as needed for pain to avoid weightbearing. Schedule an appointment with your orthopedic surgeon to follow-up on your injury and visit today.

## 2019-03-15 ENCOUNTER — Encounter (HOSPITAL_BASED_OUTPATIENT_CLINIC_OR_DEPARTMENT_OTHER): Payer: Self-pay | Admitting: Emergency Medicine

## 2019-03-15 ENCOUNTER — Emergency Department (HOSPITAL_BASED_OUTPATIENT_CLINIC_OR_DEPARTMENT_OTHER): Payer: Medicare Other

## 2019-03-15 ENCOUNTER — Emergency Department (HOSPITAL_BASED_OUTPATIENT_CLINIC_OR_DEPARTMENT_OTHER)
Admission: EM | Admit: 2019-03-15 | Discharge: 2019-03-15 | Disposition: A | Discharge status: Home / Self Care | Payer: Medicare Other | Attending: Emergency Medicine | Admitting: Emergency Medicine

## 2019-03-15 ENCOUNTER — Other Ambulatory Visit: Payer: Self-pay

## 2019-03-15 DIAGNOSIS — M25551 Pain in right hip: Secondary | ICD-10-CM

## 2019-03-15 DIAGNOSIS — J449 Chronic obstructive pulmonary disease, unspecified: Secondary | ICD-10-CM | POA: Diagnosis not present

## 2019-03-15 DIAGNOSIS — Z87891 Personal history of nicotine dependence: Secondary | ICD-10-CM | POA: Insufficient documentation

## 2019-03-15 DIAGNOSIS — Z79899 Other long term (current) drug therapy: Secondary | ICD-10-CM | POA: Insufficient documentation

## 2019-03-15 DIAGNOSIS — I10 Essential (primary) hypertension: Secondary | ICD-10-CM | POA: Diagnosis not present

## 2019-03-15 DIAGNOSIS — G8929 Other chronic pain: Secondary | ICD-10-CM | POA: Diagnosis not present

## 2019-03-15 DIAGNOSIS — Z96642 Presence of left artificial hip joint: Secondary | ICD-10-CM | POA: Diagnosis not present

## 2019-03-15 MED ORDER — KETOROLAC TROMETHAMINE 60 MG/2ML IM SOLN
30.0000 mg | Freq: Once | INTRAMUSCULAR | Status: AC
  Administered 2019-03-15: 30 mg via INTRAMUSCULAR
  Filled 2019-03-15: qty 2

## 2019-03-15 MED ORDER — NAPROXEN 375 MG PO TABS: 375.0000 mg | ORAL_TABLET | Freq: Two times a day (BID) | ORAL | 0 refills | Status: AC

## 2019-03-15 NOTE — ED Triage Notes (Signed)
Pt has right hip/groin pain x 2 weeks that radiates down leg.

## 2019-03-15 NOTE — ED Provider Notes (Signed)
[facility] EMERGENCY DEPARTMENT Provider Note   CSN: [id number] Arrival date & time: 03/15/19  1440    History   Chief Complaint Chief Complaint  Patient presents with  . Leg Pain  . Hip Pain    HPI Karen Woods is a 73 y.o. female.     The history is provided by the patient.  Hip Pain This is a chronic problem. The current episode started more than 1 week ago. The problem occurs daily. The problem has not changed since onset.Pertinent negatives include no chest pain, no abdominal pain, no headaches and no shortness of breath. The symptoms are aggravated by walking. Nothing relieves the symptoms. She has tried nothing for the symptoms. The treatment provided no relief.    Past Medical History:  Diagnosis Date  . Arthritis   . Back pain   . COPD (chronic obstructive pulmonary disease) (HCC)    Mild  . Coronary artery disease   . Depression   . GERD (gastroesophageal reflux disease)   . Heart murmur   . History of aortic stenosis    Severe  . History of prolapse of bladder   . Hyperlipidemia   . Hypertension   . Osteoporosis   . Pneumonia   . PONV (postoperative nausea and vomiting)   . Pre-diabetes   . Small vessel disease, cerebrovascular   . Vertebrobasilar artery insufficiency   . Vertigo     Patient Active Problem List   Diagnosis Date Noted  . OA (osteoarthritis) of hip 10/03/2018    Past Surgical History:  Procedure Laterality Date  . ABDOMINAL HYSTERECTOMY    . Ablation on neck    . AORTIC VALVE REPLACEMENT (AVR)/CORONARY ARTERY BYPASS GRAFTING (CABG)  10/19/2013  . BACK SURGERY  03/18/2015  . BLEPHAROPLASTY    . CARDIAC CATHETERIZATION    . COLONOSCOPY  12/09/2007  . SACROSPINOUS LIGAMENT FIXATION  06/25/2016  . TOTAL HIP ARTHROPLASTY Left 10/03/2018   Procedure: TOTAL HIP ARTHROPLASTY ANTERIOR APPROACH;  Surgeon: Ollen GrossAluisio, Frank, MD;  Location: [facility];  Service: Orthopedics;  Laterality: Left;  100min     OB History   No obstetric  history on file.      Home Medications    Prior to Admission medications   Medication Sig Start Date End Date Taking? Authorizing Provider  albuterol (PROVENTIL) (5 MG/ML) 0.5% nebulizer solution Take 2.5 mg by nebulization every 6 (six) hours as needed for wheezing or shortness of breath.    [provider]  estradiol (ESTRACE) 0.5 MG tablet Take 0.5 mg by mouth daily.    [provider]  gabapentin (NEURONTIN) 800 MG tablet Take 800 mg by mouth 3 (three) times daily.     [provider]  Glucosamine-Chondroit-Vit C-Mn (GLUCOSAMINE CHONDR 1500 COMPLX PO) Take 1 tablet by mouth daily.    [provider]  HYDROcodone-acetaminophen (NORCO/VICODIN) 5-325 MG tablet Take 1-2 tablets by mouth every 6 (six) hours as needed for severe pain. 10/04/18   Edmisten, Lyn HollingsheadKristie L, PA  methocarbamol (ROBAXIN) 500 MG tablet Take 1 tablet (500 mg total) by mouth every 6 (six) hours as needed for muscle spasms. Patient not taking: Reported on 03/03/2019 10/04/18   Arther AbbottEdmisten, Kristie L, PA  metoprolol tartrate (LOPRESSOR) 25 MG tablet Take 25 mg by mouth 2 (two) times daily.    [provider]  Multiple Vitamins-Minerals (MULTIVITAMIN WITH MINERALS) tablet Take 1 tablet by mouth daily.    [provider]  naproxen (NAPROSYN) 375 MG tablet Take 1 tablet (  375 mg total) by mouth 2 (two) times daily with a meal for 10 days. 03/15/19 03/25/19  Laquita Harlan, DO  omeprazole (PRILOSEC) 40 MG capsule Take 40 mg by mouth daily.    [provider]  oxyCODONE-acetaminophen (PERCOCET) 5-325 MG tablet Take 1-2 tablets by mouth every 6 (six) hours as needed. 10/10/18   Geoffery Lyonselo, Douglas, MD  rosuvastatin (CRESTOR) 40 MG tablet Take 40 mg by mouth daily.    [provider]  traMADol (ULTRAM) 50 MG tablet Take 1-2 tablets (50-100 mg total) by mouth every 6 (six) hours as needed for moderate pain. 10/04/18   Edmisten, Lyn HollingsheadKristie L, PA    Family History History reviewed. No  pertinent family history.  Social History Social History   Tobacco Use  . Smoking status: Former Games developermoker  . Smokeless tobacco: Never Used  Substance Use Topics  . Alcohol use: Not Currently  . Drug use: Never     Allergies   Sulfamethoxazole, Morphine and related, and Codeine   Review of Systems Review of Systems  Constitutional: Negative for chills and fever.  HENT: Negative for ear pain and sore throat.   Eyes: Negative for pain and visual disturbance.  Respiratory: Negative for cough and shortness of breath.   Cardiovascular: Negative for chest pain and palpitations.  Gastrointestinal: Negative for abdominal pain and vomiting.  Genitourinary: Negative for dysuria and hematuria.  Musculoskeletal: Positive for arthralgias. Negative for back pain.  Skin: Negative for color change and rash.  Neurological: Negative for seizures, syncope and headaches.  All other systems reviewed and are negative.    Physical Exam Updated Vital Signs  ED Triage Vitals  Enc Vitals Group     BP 03/15/19 1449 105/68     Pulse Rate 03/15/19 1449 64     Resp 03/15/19 1449 16     Temp 03/15/19 1449 98.2 F (36.8 C)     Temp Source 03/15/19 1449 Oral     SpO2 03/15/19 1449 99 %     Weight 03/15/19 1450 152 lb (68.9 kg)     Height 03/15/19 1450 5\' 4"  (1.626 m)     Head Circumference --      Peak Flow --      Pain Score 03/15/19 1501 10     Pain Loc --      Pain Edu? --      Excl. in GC? --     Physical Exam Vitals signs and nursing note reviewed.  Constitutional:      General: She is not in acute distress.    Appearance: She is well-developed.  HENT:     Head: Normocephalic and atraumatic.  Eyes:     Extraocular Movements: Extraocular movements intact.     Conjunctiva/sclera: Conjunctivae normal.     Pupils: Pupils are equal, round, and reactive to light.  Neck:     Musculoskeletal: Neck supple.  Cardiovascular:     Rate and Rhythm: Normal rate and regular rhythm.     Heart  sounds: No murmur.  Pulmonary:     Effort: Pulmonary effort is normal. No respiratory distress.     Breath sounds: Normal breath sounds.  Abdominal:     Palpations: Abdomen is soft.     Tenderness: There is no abdominal tenderness.  Musculoskeletal: Normal range of motion.        General: Tenderness (right hip) present. No swelling or signs of injury.     Right lower leg: No edema.     Left lower leg:  No edema.  Skin:    General: Skin is warm and dry.  Neurological:     General: No focal deficit present.     Mental Status: She is alert and oriented to person, place, and time.     Sensory: No sensory deficit.     Motor: No weakness.     Gait: Gait normal.     Comments: 5+ out of 5 strength in the lower extremities  Psychiatric:        Mood and Affect: Mood normal.      ED Treatments / Results  Labs (all labs ordered are listed, but only abnormal results are displayed) Labs Reviewed - No data to display  EKG None  Radiology Dg Hip Unilat With Pelvis 2-3 Views Right  Result Date: 03/15/2019 CLINICAL DATA:  Right hip pain for 2 weeks.  No known injury. EXAM: DG HIP (WITH OR WITHOUT PELVIS) 2-3V RIGHT COMPARISON:  Left hip radiographs 03/03/2019. FINDINGS: The bones are mildly demineralized. There is no evidence of acute fracture, dislocation or right femoral head avascular necrosis. Moderate joint space narrowing at the right hip is stable. Patient is status post left total hip arthroplasty and lower lumbar fusion. The sacroiliac joints appear intact. IMPRESSION: No demonstrated acute osseous findings. Stable right hip degenerative changes and postsurgical changes as described. Electronically Signed   By: Richardean Sale M.D.   On: 03/15/2019 15:46    Procedures Procedures (including critical care time)  Medications Ordered in ED Medications  ketorolac (TORADOL) injection 30 mg (has no administration in time range)     Initial Impression / Assessment and Plan / ED Course   I have reviewed the triage vital signs and the nursing notes.  Pertinent labs & imaging results that were available during my care of the patient were reviewed by me and considered in my medical decision making (see chart for details).     Karen Woods is a 73 year old female with history of osteoporosis who presents to the ED with chronic right hip pain.  Pain slightly worse over the last 2 weeks.  No new trauma.  Has had a hip replacement in the left.  Has been told she may need a hip replacement in the right.  Patient with normal exam.  No red flag signs for back pain.  No signs of cord compression.  Pain is localized to the right hip.  X-ray showed no new findings.  Patient was given IM Toradol.  Given prescription for naproxen.  Recommend follow-up with orthopedic.  Follow-up with pain management.  Discharged in good condition.  Given return precautions.  This chart was dictated using voice recognition software.  Despite best efforts to proofread,  errors can occur which can change the documentation meaning.    Final Clinical Impressions(s) / ED Diagnoses   Final diagnoses:  Right hip pain    ED Discharge Orders         Ordered    naproxen (NAPROSYN) 375 MG tablet  2 times daily with meals     03/15/19 1552           Debbie Yearick, DO 03/15/19 1552

## 2019-03-15 NOTE — Discharge Instructions (Addendum)
Hip x-ray shows no fractures.  Chronic arthritic changes.  Follow-up with the orthopedic as discussed.  Try naproxen for pain.

## 2019-05-03 NOTE — H&P (Signed)
TOTAL HIP ADMISSION H&P  Patient is admitted for right total hip arthroplasty.  Subjective:  Chief Complaint: right hip pain  HPI: Karen Woods, 73 y.o. female, has a history of pain and functional disability in the right hip(s) due to arthritis and patient has failed non-surgical conservative treatments for greater than 12 weeks to include corticosteriod injections and activity modification.  Onset of symptoms was gradual starting 1 years ago with gradually worsening course since that time.The patient noted no past surgery on the right hip(s).  Patient currently rates pain in the right hip at 9 out of 10 with activity. Patient has night pain, worsening of pain with activity and weight bearing and pain that interfers with activities of daily living. 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 This condition presents safety issues increasing the risk of falls. There is no current active infection.  Patient Active Problem List   Diagnosis Date Noted  . OA (osteoarthritis) of hip 10/03/2018   Past Medical History:  Diagnosis Date  . Arthritis   . Back pain   . COPD (chronic obstructive pulmonary disease) (HCC)    Mild  . Coronary artery disease   . Depression   . GERD (gastroesophageal reflux disease)   . Heart murmur   . History of aortic stenosis    Severe  . History of prolapse of bladder   . Hyperlipidemia   . Hypertension   . Osteoporosis   . Pneumonia   . PONV (postoperative nausea and vomiting)   . Pre-diabetes   . Small vessel disease, cerebrovascular   . Vertebrobasilar artery insufficiency   . Vertigo     Past Surgical History:  Procedure Laterality Date  . ABDOMINAL HYSTERECTOMY    . Ablation on neck    . AORTIC VALVE REPLACEMENT (AVR)/CORONARY ARTERY BYPASS GRAFTING (CABG)  10/19/2013  . BACK SURGERY  03/18/2015  . BLEPHAROPLASTY    . CARDIAC CATHETERIZATION    . COLONOSCOPY  12/09/2007  . SACROSPINOUS  LIGAMENT FIXATION  06/25/2016  . TOTAL HIP ARTHROPLASTY Left 10/03/2018   Procedure: TOTAL HIP ARTHROPLASTY ANTERIOR APPROACH;  Surgeon: Ollen GrossAluisio, Frank, MD;  Location: [facility];  Service: Orthopedics;  Laterality: Left;  100min    No current facility-administered medications for this encounter.    Current Outpatient Medications  Medication Sig Dispense Refill Last Dose  . albuterol (PROVENTIL) (5 MG/ML) 0.5% nebulizer solution Take 2.5 mg by nebulization every 6 (six) hours as needed for wheezing or shortness of breath.   Unknown at Unknown time  . estradiol (ESTRACE) 0.5 MG tablet Take 0.5 mg by mouth daily.   03/15/2019 at Unknown time  . gabapentin (NEURONTIN) 800 MG tablet Take 800 mg by mouth 3 (three) times daily.    03/15/2019 at Unknown time  . Glucosamine-Chondroit-Vit C-Mn (GLUCOSAMINE CHONDR 1500 COMPLX PO) Take 1 tablet by mouth daily.   03/15/2019 at Unknown time  . HYDROcodone-acetaminophen (NORCO/VICODIN) 5-325 MG tablet Take 1-2 tablets by mouth every 6 (six) hours as needed for severe pain. 56 tablet 0 03/03/2019 at Unknown time  . methocarbamol (ROBAXIN) 500 MG tablet Take 1 tablet (500 mg total) by mouth every 6 (six) hours as needed for muscle spasms. (Patient not taking: Reported on 03/03/2019) 40 tablet 0 Not Taking at Unknown time  . metoprolol tartrate (LOPRESSOR) 25 MG tablet Take 25 mg by mouth 2 (two) times daily.   03/15/2019 at Unknown time  . Multiple Vitamins-Minerals (MULTIVITAMIN WITH MINERALS) tablet Take 1  tablet by mouth daily.   03/15/2019 at Unknown time  . omeprazole (PRILOSEC) 40 MG capsule Take 40 mg by mouth daily.   03/15/2019 at Unknown time  . oxyCODONE-acetaminophen (PERCOCET) 5-325 MG tablet Take 1-2 tablets by mouth every 6 (six) hours as needed. 20 tablet 0   . rosuvastatin (CRESTOR) 40 MG tablet Take 40 mg by mouth daily.   03/15/2019 at Unknown time  . traMADol (ULTRAM) 50 MG tablet Take 1-2 tablets (50-100 mg total) by mouth every 6 (six) hours as needed for  moderate pain. 40 tablet 0    Allergies  Allergen Reactions  . Sulfamethoxazole Anaphylaxis  . Morphine And Related Nausea And Vomiting  . Codeine Rash    Social History   Tobacco Use  . Smoking status: Former Games developermoker  . Smokeless tobacco: Never Used  Substance Use Topics  . Alcohol use: Not Currently    No family history on file.   Review of Systems  Constitutional: Negative for chills and fever.  HENT: Negative for congestion, sore throat and tinnitus.   Eyes: Negative for double vision, photophobia and pain.  Respiratory: Negative for cough, shortness of breath and wheezing.   Cardiovascular: Negative for chest pain, palpitations and orthopnea.  Gastrointestinal: Negative for heartburn, nausea and vomiting.  Genitourinary: Negative for dysuria, frequency and urgency.  Musculoskeletal: Positive for joint pain.  Neurological: Negative for dizziness, weakness and headaches.    Objective:  Physical Exam  Well nourished and well developed.  General: Alert and oriented x3, cooperative and pleasant, no acute distress.  Head: normocephalic, atraumatic, neck supple.  Eyes: EOMI.  Respiratory: breath sounds clear in all fields, no wheezing, rales, or rhonchi. Cardiovascular: Regular rate and rhythm, no murmurs, gallops or rubs.  Abdomen: non-tender to palpation and soft, normoactive bowel sounds. Musculoskeletal:  Right Hip Exam: No tenderness to palpation about the greater trochanter. Pain with passive ROM of the right hip. Range of motion 110 degrees flexion, 5 degrees IR, 5 degrees ER, and 20 degrees abduction.  Calves soft and nontender. Motor function intact in LE. Strength 5/5 LE bilaterally. Neuro: Distal pulses 2+. Sensation to light touch intact in LE.  Vital signs in last 24 hours: Blood pressure: 160/82 mmHg Pulse: 60 bpm  Labs:   Estimated body mass index is 26.09 kg/m as calculated from the following:   Height as of 03/15/19: 5\' 4"  (1.626 m).   Weight as  of 03/15/19: 68.9 kg.   Imaging Review Plain radiographs demonstrate moderate degenerative joint disease of the right hip(s). The bone quality appears to be adequate for age and reported activity level.  Assessment/Plan:  End stage arthritis, right hip(s)  The patient history, physical examination, clinical judgement of the provider and imaging studies are consistent with end stage degenerative joint disease of the right hip(s) and total hip arthroplasty is deemed medically necessary. The treatment options including medical management, injection therapy, arthroscopy and arthroplasty were discussed at length. The risks and benefits of total hip arthroplasty were presented and reviewed. The risks due to aseptic loosening, infection, stiffness, dislocation/subluxation,  thromboembolic complications and other imponderables were discussed.  The patient acknowledged the explanation, agreed to proceed with the plan and consent was signed. Patient is being admitted for inpatient treatment for surgery, pain control, PT, OT, prophylactic antibiotics, VTE prophylaxis, progressive ambulation and ADL's and discharge planning.The patient is planning to be discharged home.   Anticipated LOS equal to or greater than 2 midnights due to - Age 74 and older with one  or more of the following:  - Obesity  - Expected need for hospital services (PT, OT, Nursing) required for safe  discharge  - Anticipated need for postoperative skilled nursing care or inpatient rehab  - Active co-morbidities: Respiratory Failure/COPD OR   - Unanticipated findings during/Post Surgery: None  - Patient is a high risk of re-admission due to: None  Therapy Plans: HEP Disposition: Home with husband Planned DVT Prophylaxis: Aspirin 325 mg BID DME needed: None PCP: Glendale Chard, MD Cardiologist: Ricky Ala, MD TXA: IV Allergies: Codeine (rash), morphine (nausea/vomiting) Anesthesia Concerns: None BMI: 26.7 Other: Takes Norco 5-325  BID for arthritis in bilateral hands  - Patient was instructed on what medications to stop prior to surgery. - Follow-up visit in 2 weeks with Dr. Wynelle Link - Begin physical therapy following surgery - Pre-operative lab work as pre-surgical testing - Prescriptions will be provided in hospital at time of discharge  Theresa Duty, PA-C Orthopedic Surgery [facility] [place]

## 2019-05-25 NOTE — Progress Notes (Addendum)
03/23/2019-clearance from Talbert Cage on chart (Cardiology)  03/01/2019- noted on chart- ECHO  from Talbert Cage, NP   02/02/2019- clearance on chart from  Dr. Juanita Laster

## 2019-05-25 NOTE — Patient Instructions (Addendum)
DUE TO COVID-19 ONLY ONE VISITOR IS ALLOWED TO COME WITH YOU AND STAY IN THE WAITING ROOM ONLY DURING PRE OP AND PROCEDURE DAY OF SURGERY. THE 1 VISITOR MAY VISIT WITH YOU AFTER SURGERY IN YOUR PRIVATE ROOM DURING VISITING HOURS ONLY!  YOU NEED TO HAVE A COVID 19 TEST ON_____Saturday 09/05/2020__ @___1130  am____, THIS TEST MUST BE DONE BEFORE SURGERY, COME  [address], [place] , [address].  [facility])  PLEASE COME 15  MINUTES BEFORE APPOINTMENT TO CHECK IN!   ONCE YOUR COVID TEST IS COMPLETED, PLEASE BEGIN THE QUARANTINE INSTRUCTIONS AS OUTLINED IN YOUR HANDOUT.                Karen Woods  05/25/2019   Your procedure is scheduled on: Wednesday 05/31/2019   Report to [facility] Main  Entrance              Report to admitting at  0920 AM     Call this number if you have problems the morning of surgery [phone]    Remember: Do not eat food  :After Midnight.               NO SOLID FOOD AFTER MIDNIGHT THE NIGHT PRIOR TO SURGERY. YOU MAY HAVE CLEAR LIQUIDS FROM MIDNIGHT   UNTIL  0850 AM.              PLEASE FINISH Gatorade G2 DRINK PER SURGEON ORDER  WHICH NEEDS TO BE COMPLETED AT 0850 AM. .   CLEAR LIQUID DIET   Foods Allowed                                                                     Foods Excluded  Coffee and tea, regular and decaf                             liquids that you cannot  Plain Jell-O any favor except red or purple                                           see through such as: Fruit ices (not with fruit pulp)                                     milk, soups, orange juice  Iced Popsicles                                    All solid food Carbonated beverages, regular and diet                                    Cranberry, grape and apple juices Sports drinks like Gatorade Lightly seasoned clear broth or consume(fat free) Sugar, honey syrup  Sample Menu Breakfast  Lunch                                      Supper Cranberry juice                    Beef broth                            Chicken broth Jell-O                                     Grape juice                           Apple juice Coffee or tea                        Jell-O                                      Popsicle                                                Coffee or tea                        Coffee or tea  _____________________________________________________________________               BRUSH YOUR TEETH MORNING OF SURGERY AND RINSE YOUR MOUTH OUT, NO CHEWING GUM CANDY OR MINTS.     Take these medicines the morning of surgery with A SIP OF WATER: Metoprolol tartrate (Lopressor), Omeprazole (Prilosec), Rosuvastatin (Crestor), Gabapentin (Neurontin), use Albuterol inhaler and         nebulizer if needed and bring inhaler with you to the hospital.                                 You may not have any metal on your body including hair pins and              piercings  Do not wear jewelry, make-up, lotions, powders or perfumes, deodorant             Do not wear nail polish.  Do not shave  48 hours prior to surgery.              Do not bring valuables to the hospital. [facility] IS NOT             RESPONSIBLE   FOR VALUABLES.  Contacts, dentures or bridgework may not be worn into surgery.  Leave suitcase in the car. After surgery it may be brought to your room.                  Please read over the following fact sheets you were given: _____________________________________________________________________             [facility] Before surgery, you can play an important role.  Because  skin is not sterile, your skin needs to be as free of germs as possible.  You can reduce the number of germs on your skin by washing with CHG (chlorahexidine gluconate) soap before surgery.  CHG is an antiseptic cleaner which kills germs and bonds with the skin to continue killing germs even after  washing. Please DO NOT use if you have an allergy to CHG or antibacterial soaps.  If your skin becomes reddened/irritated stop using the CHG and inform your nurse when you arrive at Short Stay. Do not shave (including legs and underarms) for at least 48 hours prior to the first CHG shower.  You may shave your face/neck. Please follow these instructions carefully:  1.  Shower with CHG Soap the night before surgery and the  morning of Surgery.  2.  If you choose to wash your hair, wash your hair first as usual with your  normal  shampoo.  3.  After you shampoo, rinse your hair and body thoroughly to remove the  shampoo.                           4.  Use CHG as you would any other liquid soap.  You can apply chg directly  to the skin and wash                       Gently with a scrungie or clean washcloth.  5.  Apply the CHG Soap to your body ONLY FROM THE NECK DOWN.   Do not use on face/ open                           Wound or open sores. Avoid contact with eyes, ears mouth and genitals (private parts).                       Wash face,  Genitals (private parts) with your normal soap.             6.  Wash thoroughly, paying special attention to the area where your surgery  will be performed.  7.  Thoroughly rinse your body with warm water from the neck down.  8.  DO NOT shower/wash with your normal soap after using and rinsing off  the CHG Soap.                9.  Pat yourself dry with a clean towel.            10.  Wear clean pajamas.            11.  Place clean sheets on your bed the night of your first shower and do not  sleep with pets. Day of Surgery : Do not apply any lotions/deodorants the morning of surgery.  Please wear clean clothes to the hospital/surgery center.  FAILURE TO FOLLOW THESE INSTRUCTIONS MAY RESULT IN THE CANCELLATION OF YOUR SURGERY PATIENT SIGNATURE_________________________________  NURSE  SIGNATURE__________________________________  ________________________________________________________________________   Karen Woods  An incentive spirometer is a tool that can help keep your lungs clear and active. This tool measures how well you are filling your lungs with each breath. Taking long deep breaths may help reverse or decrease the chance of developing breathing (pulmonary) problems (especially infection) following:  A long period of time when you are unable to move or be active. BEFORE THE PROCEDURE   If  the spirometer includes an indicator to show your best effort, your nurse or respiratory therapist will set it to a desired goal.  If possible, sit up straight or lean slightly forward. Try not to slouch.  Hold the incentive spirometer in an upright position. INSTRUCTIONS FOR USE  1. Sit on the edge of your bed if possible, or sit up as far as you can in bed or on a chair. 2. Hold the incentive spirometer in an upright position. 3. Breathe out normally. 4. Place the mouthpiece in your mouth and seal your lips tightly around it. 5. Breathe in slowly and as deeply as possible, raising the piston or the ball toward the top of the column. 6. Hold your breath for 3-5 seconds or for as long as possible. Allow the piston or ball to fall to the bottom of the column. 7. Remove the mouthpiece from your mouth and breathe out normally. 8. Rest for a few seconds and repeat Steps 1 through 7 at least 10 times every 1-2 hours when you are awake. Take your time and take a few normal breaths between deep breaths. 9. The spirometer may include an indicator to show your best effort. Use the indicator as a goal to work toward during each repetition. 10. After each set of 10 deep breaths, practice coughing to be sure your lungs are clear. If you have an incision (the cut made at the time of surgery), support your incision when coughing by placing a pillow or rolled up towels firmly  against it. Once you are able to get out of bed, walk around indoors and cough well. You may stop using the incentive spirometer when instructed by your caregiver.  RISKS AND COMPLICATIONS  Take your time so you do not get dizzy or light-headed.  If you are in pain, you may need to take or ask for pain medication before doing incentive spirometry. It is harder to take a deep breath if you are having pain. AFTER USE  Rest and breathe slowly and easily.  It can be helpful to keep track of a log of your progress. Your caregiver can provide you with a simple table to help with this. If you are using the spirometer at home, follow these instructions: SEEK MEDICAL CARE IF:   You are having difficultly using the spirometer.  You have trouble using the spirometer as often as instructed.  Your pain medication is not giving enough relief while using the spirometer.  You develop fever of 100.5 F (38.1 C) or higher. SEEK IMMEDIATE MEDICAL CARE IF:   You cough up bloody sputum that had not been present before.  You develop fever of 102 F (38.9 C) or greater.  You develop worsening pain at or near the incision site. MAKE SURE YOU:   Understand these instructions.  Will watch your condition.  Will get help right away if you are not doing well or get worse. Document Released: 01/18/2007 Document Revised: 11/30/2011 Document Reviewed: 03/21/2007 ExitCare Patient Information 2014 [employer], [place].   ________________________________________________________________________  WHAT IS A BLOOD TRANSFUSION? Blood Transfusion Information  A transfusion is the replacement of blood or some of its parts. Blood is made up of multiple cells which provide different functions.  Red blood cells carry oxygen and are used for blood loss replacement.  White blood cells fight against infection.  Platelets control bleeding.  Plasma helps clot blood.  Other blood products are available for  specialized needs, such as hemophilia or other clotting disorders.  BEFORE THE TRANSFUSION  Who gives blood for transfusions?   Healthy volunteers who are fully evaluated to make sure their blood is safe. This is blood bank blood. Transfusion therapy is the safest it has ever been in the practice of medicine. Before blood is taken from a donor, a complete history is taken to make sure that person has no history of diseases nor engages in risky social behavior (examples are intravenous drug use or sexual activity with multiple partners). The donor's travel history is screened to minimize risk of transmitting infections, such as malaria. The donated blood is tested for signs of infectious diseases, such as HIV and hepatitis. The blood is then tested to be sure it is compatible with you in order to minimize the chance of a transfusion reaction. If you or a relative donates blood, this is often done in anticipation of surgery and is not appropriate for emergency situations. It takes many days to process the donated blood. RISKS AND COMPLICATIONS Although transfusion therapy is very safe and saves many lives, the main dangers of transfusion include:   Getting an infectious disease.  Developing a transfusion reaction. This is an allergic reaction to something in the blood you were given. Every precaution is taken to prevent this. The decision to have a blood transfusion has been considered carefully by your caregiver before blood is given. Blood is not given unless the benefits outweigh the risks. AFTER THE TRANSFUSION  Right after receiving a blood transfusion, you will usually feel much better and more energetic. This is especially true if your red blood cells have gotten low (anemic). The transfusion raises the level of the red blood cells which carry oxygen, and this usually causes an energy increase.  The nurse administering the transfusion will monitor you carefully for complications. HOME CARE  INSTRUCTIONS  No special instructions are needed after a transfusion. You may find your energy is better. Speak with your caregiver about any limitations on activity for underlying diseases you may have. SEEK MEDICAL CARE IF:   Your condition is not improving after your transfusion.  You develop redness or irritation at the intravenous (IV) site. SEEK IMMEDIATE MEDICAL CARE IF:  Any of the following symptoms occur over the next 12 hours:  Shaking chills.  You have a temperature by mouth above 102 F (38.9 C), not controlled by medicine.  Chest, back, or muscle pain.  People around you feel you are not acting correctly or are confused.  Shortness of breath or difficulty breathing.  Dizziness and fainting.  You get a rash or develop hives.  You have a decrease in urine output.  Your urine turns a dark color or changes to pink, red, or brown. Any of the following symptoms occur over the next 10 days:  You have a temperature by mouth above 102 F (38.9 C), not controlled by medicine.  Shortness of breath.  Weakness after normal activity.  The white part of the eye turns yellow (jaundice).  You have a decrease in the amount of urine or are urinating less often.  Your urine turns a dark color or changes to pink, red, or brown. Document Released: 09/04/2000 Document Revised: 11/30/2011 Document Reviewed: 04/23/2008 [facility] Patient Information 2014 [place], [place].  _______________________________________________________________________

## 2019-05-26 ENCOUNTER — Other Ambulatory Visit: Payer: Self-pay

## 2019-05-26 ENCOUNTER — Encounter (HOSPITAL_COMMUNITY)
Admission: RE | Admit: 2019-05-26 | Discharge: 2019-05-26 | Disposition: A | Discharge status: Home / Self Care | Payer: Medicare Other | Source: Ambulatory Visit | Attending: Orthopedic Surgery | Admitting: Orthopedic Surgery

## 2019-05-26 ENCOUNTER — Encounter (HOSPITAL_COMMUNITY): Payer: Self-pay

## 2019-05-26 DIAGNOSIS — M1611 Unilateral primary osteoarthritis, right hip: Secondary | ICD-10-CM | POA: Insufficient documentation

## 2019-05-26 DIAGNOSIS — R7303 Prediabetes: Secondary | ICD-10-CM | POA: Diagnosis present

## 2019-05-26 DIAGNOSIS — K219 Gastro-esophageal reflux disease without esophagitis: Secondary | ICD-10-CM | POA: Diagnosis not present

## 2019-05-26 DIAGNOSIS — E785 Hyperlipidemia, unspecified: Secondary | ICD-10-CM | POA: Diagnosis not present

## 2019-05-26 DIAGNOSIS — Z7982 Long term (current) use of aspirin: Secondary | ICD-10-CM | POA: Diagnosis not present

## 2019-05-26 DIAGNOSIS — I2510 Atherosclerotic heart disease of native coronary artery without angina pectoris: Secondary | ICD-10-CM | POA: Diagnosis not present

## 2019-05-26 DIAGNOSIS — Z01818 Encounter for other preprocedural examination: Secondary | ICD-10-CM | POA: Diagnosis present

## 2019-05-26 DIAGNOSIS — R011 Cardiac murmur, unspecified: Secondary | ICD-10-CM | POA: Diagnosis not present

## 2019-05-26 DIAGNOSIS — Z7901 Long term (current) use of anticoagulants: Secondary | ICD-10-CM | POA: Diagnosis not present

## 2019-05-26 DIAGNOSIS — Z79899 Other long term (current) drug therapy: Secondary | ICD-10-CM | POA: Diagnosis not present

## 2019-05-26 DIAGNOSIS — Z20828 Contact with and (suspected) exposure to other viral communicable diseases: Secondary | ICD-10-CM | POA: Diagnosis not present

## 2019-05-26 DIAGNOSIS — Z87891 Personal history of nicotine dependence: Secondary | ICD-10-CM | POA: Diagnosis not present

## 2019-05-26 DIAGNOSIS — I10 Essential (primary) hypertension: Secondary | ICD-10-CM | POA: Diagnosis not present

## 2019-05-26 DIAGNOSIS — J449 Chronic obstructive pulmonary disease, unspecified: Secondary | ICD-10-CM | POA: Diagnosis present

## 2019-05-26 LAB — SURGICAL PCR SCREEN
MRSA, PCR: NEGATIVE
Staphylococcus aureus: NEGATIVE

## 2019-05-26 LAB — COMPREHENSIVE METABOLIC PANEL
ALT: 20 U/L (ref 0–44)
AST: 20 U/L (ref 15–41)
Albumin: 4.4 g/dL (ref 3.5–5.0)
Alkaline Phosphatase: 71 U/L (ref 38–126)
Anion gap: 8 (ref 5–15)
BUN: 17 mg/dL (ref 8–23)
CO2: 27 mmol/L (ref 22–32)
Calcium: 9.3 mg/dL (ref 8.9–10.3)
Chloride: 104 mmol/L (ref 98–111)
Creatinine, Ser: 0.61 mg/dL (ref 0.44–1.00)
GFR calc Af Amer: >60 mL/min (ref >60)
GFR calc non Af Amer: >60 mL/min (ref >60)
Glucose, Bld: 70 mg/dL (ref 70–99)
Potassium: 4.1 mmol/L (ref 3.5–5.1)
Sodium: 139 mmol/L (ref 135–145)
Total Bilirubin: 1.4 mg/dL — ABNORMAL HIGH (ref 0.3–1.2)
Total Protein: 7.6 g/dL (ref 6.5–8.1)

## 2019-05-26 LAB — CBC
HCT: 41.9 % (ref 36.0–46.0)
Hemoglobin: 13.2 g/dL (ref 12.0–15.0)
MCH: 28.5 pg (ref 26.0–34.0)
MCHC: 31.5 g/dL (ref 30.0–36.0)
MCV: 90.5 fL (ref 80.0–100.0)
Platelets: 267 10*3/uL (ref 150–400)
RBC: 4.63 MIL/uL (ref 3.87–5.11)
RDW: 14.6 % (ref 11.5–15.5)
WBC: 9.5 10*3/uL (ref 4.0–10.5)
nRBC: 0 % (ref 0.0–0.2)

## 2019-05-26 LAB — HEMOGLOBIN A1C
Hgb A1c MFr Bld: 6 % — ABNORMAL HIGH (ref 4.8–5.6)
Mean Plasma Glucose: 125.5 mg/dL

## 2019-05-26 LAB — PROTIME-INR
INR: 1 (ref 0.8–1.2)
Prothrombin Time: 12.7 seconds (ref 11.4–15.2)

## 2019-05-26 LAB — APTT: aPTT: 26 seconds (ref 24–36)

## 2019-05-26 NOTE — Progress Notes (Signed)
PCP - Dr. Glendale Chard [facility]  note on chart from 02/02/2019  Cardiologist - Talbert Cage, NP  Clearance on chart 03/23/2019  Chest x-ray - N/A EKG - 05/26/2019 Stress Test - many years ago ECHO - 03/01/2019  On chart ordered  By Talbert Cage Cardiac Cath -N/A   Sleep Study - N/A CPAP -   Fasting Blood Sugar - N/A Checks Blood Sugar _____ times a day  Blood Thinner Instructions:Stop Aspirin today 05/26/2019 by Dr. Wynelle Link Aspirin Instructions:Aspirin 81 mg daily  Last Dose:05/26/2019  Anesthesia review:   Chart given to Iver Nestle, NP to review  Patient denies shortness of breath, fever, cough and chest pain at PAT appointment   Patient verbalized understanding of instructions that were given to them at the PAT appointment. Patient was also instructed that they will need to review over the PAT instructions again at home before surgery.

## 2019-05-27 ENCOUNTER — Other Ambulatory Visit (HOSPITAL_COMMUNITY)
Admission: RE | Admit: 2019-05-27 | Discharge: 2019-05-27 | Disposition: A | Discharge status: Home / Self Care | Payer: Medicare Other | Source: Ambulatory Visit | Attending: Orthopedic Surgery | Admitting: Orthopedic Surgery

## 2019-05-27 DIAGNOSIS — Z01818 Encounter for other preprocedural examination: Secondary | ICD-10-CM | POA: Diagnosis not present

## 2019-05-28 LAB — NOVEL CORONAVIRUS, NAA (HOSP ORDER, SEND-OUT TO REF LAB; TAT 18-24 HRS): SARS-CoV-2, NAA: NOT DETECTED

## 2019-05-30 NOTE — Progress Notes (Signed)
Anesthesia Chart Review   Case: [id number] Date/Time: 05/31/19 1135   Procedure: TOTAL HIP ARTHROPLASTY ANTERIOR APPROACH (Right ) - 100min   Anesthesia type: Choice   Pre-op diagnosis: right hip osteoarthritis   [facility]: [facility] ROOM 10 / [facility]   Surgeon: Ollen GrossAluisio, Frank, MD      DISCUSSION:73 y.o. former smoker with h/o PONV, GERD, CAD, HLD, COPD, HTN, vertigo, pre-diabetes, s/p AVR 2015, right hip OA scheduled for above procedure 05/31/2019 with Dr. Ollen GrossFrank Aluisio.   Last seen by cardiology 02/02/2019.  Seen by Elissa HeftyBrandy Younts-Davis, NP.  Per OV note no obstructive CAD on cardiac cath in 2015, stable no chest pain.  Echo repeated 03/01/2019 with bioprosthetic valve functioning appropriately, no aortic regurgitation.  Cardiac clearance on chart.   Anticipate pt can proceed with planned procedure barring acute status change.   VS: BP 139/80   Pulse (!) 57   Temp 36.7 C (Oral)   Resp 18   Ht 5\' 3"  (1.6 m)   Wt 66.9 kg   SpO2 99%   BMI 26.11 kg/m   PROVIDERS: Ignacia PalmaBeck, Mark C., MD is PCP   Merrily PewKalil, Darryl, MD is Cardiologist  LABS: Labs reviewed: Acceptable for surgery. (all labs ordered are listed, but only abnormal results are displayed)  Labs Reviewed  COMPREHENSIVE METABOLIC PANEL - Abnormal; Notable for the following components:      Result Value   Total Bilirubin 1.4 (*)    All other components within normal limits  HEMOGLOBIN A1C - Abnormal; Notable for the following components:   Hgb A1c MFr Bld 6.0 (*)    All other components within normal limits  SURGICAL PCR SCREEN  APTT  CBC  PROTIME-INR  TYPE AND SCREEN     IMAGES:   EKG: 05/26/2019 Rate 54 bpm Sinus bradycardia Possible left atrial enlargement Borderline ECG No significant change since last tracing   CV: Echo 03/01/2019 Findings Mitral Valve Non-specific mitral leaflet thickening with preserved mobility. No evidence of mitral stenosis. Mild to moderate mitral regurgitation. EROA: 0.27 cm squared. Aortic  Valve There is a bioprosthetic aortic valve in the aortic position. Bioprosthetic aortic valve is functioning appropriately No aortic regurgitation. Tricuspid Valve The tricuspid valve leaflets are thin and pliable. No evidence of tricuspid stenosis. Mild tricuspid regurgitation. RV systolic pressure: 39 mmHg. Pulmonic Valve Pulmonic valve is structurally normal. No evidence of pulmonic stenosis. Trace pulmonic regurgitation. Left Atrium Borderline dilated left atrium. Left atrial volume index of 33 ml per meters squared BSA. Left Ventricle Normal left ventricular size and systolic function with no appreciable segmental abnormality. Ejection fraction is visually estimated at 55-60%. Diastolic function is indeterminate. Normal left ventricular wall thickness. Right Atrium Mildly dilated right atrium. Right atrial area of 20 cm squared. Right Ventricle Normal right ventricular size and function. Right ventricular systolic pressure of 39 mmHg, consistent with mild pulmonary hypertension. Pericardial Effusion No evidence of pericardial effusion. Pleural Effusion No pleural effusion seen. Miscellaneous The aortic root diameter is within normal limits. The IVC is dilated, but collapses > 50% with inspiration. Past Medical History:  Diagnosis Date  . Arthritis   . Back pain   . COPD (chronic obstructive pulmonary disease) (HCC)    Mild  . Coronary artery disease   . Depression   . GERD (gastroesophageal reflux disease)   . Heart murmur   . History of aortic stenosis    Severe  . History of prolapse of bladder   . Hyperlipidemia   . Hypertension   . Osteoporosis   .  Pneumonia   . PONV (postoperative nausea and vomiting)   . Pre-diabetes   . Small vessel disease, cerebrovascular   . Vertebrobasilar artery insufficiency   . Vertigo     Past Surgical History:  Procedure Laterality Date  . ABDOMINAL HYSTERECTOMY    . Ablation on neck    . AORTIC VALVE REPLACEMENT  (AVR)/CORONARY ARTERY BYPASS GRAFTING (CABG)  10/19/2013  . BACK SURGERY  03/18/2015  . BLEPHAROPLASTY    . CARDIAC CATHETERIZATION    . COLONOSCOPY  12/09/2007  . SACROSPINOUS LIGAMENT FIXATION  06/25/2016  . TOTAL HIP ARTHROPLASTY Left 10/03/2018   Procedure: TOTAL HIP ARTHROPLASTY ANTERIOR APPROACH;  Surgeon: Gaynelle Arabian, MD;  Location: [facility];  Service: Orthopedics;  Laterality: Left;  169min    MEDICATIONS: . albuterol (PROVENTIL) (5 MG/ML) 0.5% nebulizer solution  . albuterol (VENTOLIN HFA) 108 (90 Base) MCG/ACT inhaler  . aspirin EC 81 MG tablet  . cycloSPORINE (RESTASIS) 0.05 % ophthalmic emulsion  . estradiol (ESTRACE) 0.5 MG tablet  . gabapentin (NEURONTIN) 800 MG tablet  . Glucosamine-Chondroit-Vit C-Mn (GLUCOSAMINE CHONDR 1500 COMPLX PO)  . HYDROcodone-acetaminophen (NORCO/VICODIN) 5-325 MG tablet  . metoprolol tartrate (LOPRESSOR) 25 MG tablet  . Multiple Vitamins-Minerals (MULTIVITAMIN WITH MINERALS) tablet  . omeprazole (PRILOSEC) 40 MG capsule  . rosuvastatin (CRESTOR) 40 MG tablet   No current facility-administered medications for this encounter.    Maia Plan [facility] Pre-Surgical Testing [phone] 05/30/19  10:33 AM

## 2019-05-30 NOTE — Anesthesia Preprocedure Evaluation (Addendum)
Anesthesia Evaluation  Patient identified by MRN, date of birth, ID band Patient awake    Reviewed: Allergy & Precautions, NPO status , Patient's Chart, lab work & pertinent test results, reviewed documented beta blocker date and time   History of Anesthesia Complications (+) PONV and history of anesthetic complications  Airway Mallampati: I       Dental  (+) Teeth Intact   Pulmonary former smoker,    Pulmonary exam normal breath sounds clear to auscultation       Cardiovascular hypertension, Pt. on home beta blockers Normal cardiovascular exam Rhythm:Regular Rate:Normal     Neuro/Psych    GI/Hepatic Neg liver ROS, GERD  Medicated,  Endo/Other  negative endocrine ROS  Renal/GU negative Renal ROS  negative genitourinary   Musculoskeletal  (+) Arthritis , Osteoarthritis,    Abdominal Normal abdominal exam  (+)   Peds  Hematology negative hematology ROS (+)   Anesthesia Other Findings   Reproductive/Obstetrics                            Anesthesia Physical Anesthesia Plan  ASA: II  Anesthesia Plan: General   Post-op Pain Management:    Induction: Intravenous  PONV Risk Score and Plan: 4 or greater and Ondansetron and Dexamethasone  Airway Management Planned: Oral ETT  Additional Equipment: None  Intra-op Plan:   Post-operative Plan: Extubation in OR  Informed Consent: I have reviewed the patients History and Physical, chart, labs and discussed the procedure including the risks, benefits and alternatives for the proposed anesthesia with the patient or authorized representative who has indicated his/her understanding and acceptance.       Plan Discussed with: CRNA  Anesthesia Plan Comments: (See PAT note 05/26/2019, Konrad Felix, PA-C)      Anesthesia Quick Evaluation

## 2019-05-31 ENCOUNTER — Other Ambulatory Visit: Payer: Self-pay

## 2019-05-31 ENCOUNTER — Inpatient Hospital Stay (HOSPITAL_COMMUNITY)
Admission: RE | Admit: 2019-05-31 | Discharge: 2019-06-01 | DRG: 470 | Disposition: A | Discharge status: Home / Self Care | Payer: Medicare Other | Attending: Orthopedic Surgery | Admitting: Orthopedic Surgery

## 2019-05-31 ENCOUNTER — Encounter (HOSPITAL_COMMUNITY): Payer: Self-pay | Admitting: Certified Registered Nurse Anesthetist

## 2019-05-31 ENCOUNTER — Inpatient Hospital Stay (HOSPITAL_COMMUNITY): Payer: Medicare Other

## 2019-05-31 ENCOUNTER — Encounter (HOSPITAL_COMMUNITY)
Admission: RE | Disposition: A | Discharge status: Home / Self Care | Payer: Self-pay | Source: Home / Self Care | Attending: Orthopedic Surgery

## 2019-05-31 ENCOUNTER — Inpatient Hospital Stay (HOSPITAL_COMMUNITY): Payer: Medicare Other | Admitting: Certified Registered Nurse Anesthetist

## 2019-05-31 ENCOUNTER — Inpatient Hospital Stay (HOSPITAL_COMMUNITY): Payer: Medicare Other | Admitting: Physician Assistant

## 2019-05-31 DIAGNOSIS — M1611 Unilateral primary osteoarthritis, right hip: Principal | ICD-10-CM | POA: Diagnosis present

## 2019-05-31 DIAGNOSIS — F329 Major depressive disorder, single episode, unspecified: Secondary | ICD-10-CM | POA: Diagnosis present

## 2019-05-31 DIAGNOSIS — K219 Gastro-esophageal reflux disease without esophagitis: Secondary | ICD-10-CM | POA: Diagnosis present

## 2019-05-31 DIAGNOSIS — M810 Age-related osteoporosis without current pathological fracture: Secondary | ICD-10-CM | POA: Diagnosis present

## 2019-05-31 DIAGNOSIS — E785 Hyperlipidemia, unspecified: Secondary | ICD-10-CM | POA: Diagnosis present

## 2019-05-31 DIAGNOSIS — I10 Essential (primary) hypertension: Secondary | ICD-10-CM | POA: Diagnosis present

## 2019-05-31 DIAGNOSIS — Z885 Allergy status to narcotic agent status: Secondary | ICD-10-CM

## 2019-05-31 DIAGNOSIS — J449 Chronic obstructive pulmonary disease, unspecified: Secondary | ICD-10-CM | POA: Diagnosis present

## 2019-05-31 DIAGNOSIS — I2510 Atherosclerotic heart disease of native coronary artery without angina pectoris: Secondary | ICD-10-CM | POA: Diagnosis present

## 2019-05-31 DIAGNOSIS — Z951 Presence of aortocoronary bypass graft: Secondary | ICD-10-CM

## 2019-05-31 DIAGNOSIS — Z882 Allergy status to sulfonamides status: Secondary | ICD-10-CM | POA: Diagnosis not present

## 2019-05-31 DIAGNOSIS — Z90710 Acquired absence of both cervix and uterus: Secondary | ICD-10-CM | POA: Diagnosis not present

## 2019-05-31 DIAGNOSIS — Z79899 Other long term (current) drug therapy: Secondary | ICD-10-CM | POA: Diagnosis not present

## 2019-05-31 DIAGNOSIS — Z419 Encounter for procedure for purposes other than remedying health state, unspecified: Secondary | ICD-10-CM

## 2019-05-31 DIAGNOSIS — Z79891 Long term (current) use of opiate analgesic: Secondary | ICD-10-CM | POA: Diagnosis not present

## 2019-05-31 DIAGNOSIS — Z96642 Presence of left artificial hip joint: Secondary | ICD-10-CM | POA: Diagnosis present

## 2019-05-31 DIAGNOSIS — Z96649 Presence of unspecified artificial hip joint: Secondary | ICD-10-CM

## 2019-05-31 DIAGNOSIS — Z87891 Personal history of nicotine dependence: Secondary | ICD-10-CM

## 2019-05-31 DIAGNOSIS — Z952 Presence of prosthetic heart valve: Secondary | ICD-10-CM | POA: Diagnosis not present

## 2019-05-31 DIAGNOSIS — M169 Osteoarthritis of hip, unspecified: Secondary | ICD-10-CM | POA: Diagnosis present

## 2019-05-31 HISTORY — PX: TOTAL HIP ARTHROPLASTY: SHX124

## 2019-05-31 LAB — TYPE AND SCREEN
ABO/RH(D): A POS
Antibody Screen: NEGATIVE

## 2019-05-31 SURGERY — ARTHROPLASTY, HIP, TOTAL, ANTERIOR APPROACH
Anesthesia: Spinal | Site: Hip | Laterality: Right

## 2019-05-31 MED ORDER — KETOROLAC TROMETHAMINE 15 MG/ML IJ SOLN
INTRAMUSCULAR | Status: AC
  Filled 2019-05-31: qty 1

## 2019-05-31 MED ORDER — FENTANYL CITRATE (PF) 100 MCG/2ML IJ SOLN
25.0000 ug | INTRAMUSCULAR | Status: DC | PRN
  Administered 2019-05-31 (×3): 50 ug via INTRAVENOUS

## 2019-05-31 MED ORDER — FENTANYL CITRATE (PF) 100 MCG/2ML IJ SOLN
INTRAMUSCULAR | Status: AC
  Filled 2019-05-31: qty 2

## 2019-05-31 MED ORDER — DIPHENHYDRAMINE HCL 12.5 MG/5ML PO ELIX: 12.5000 mg | ORAL_SOLUTION | ORAL | Status: DC | PRN

## 2019-05-31 MED ORDER — GABAPENTIN 400 MG PO CAPS
800.0000 mg | ORAL_CAPSULE | Freq: Three times a day (TID) | ORAL | Status: DC
  Administered 2019-05-31 – 2019-06-01 (×3): 800 mg via ORAL
  Filled 2019-05-31 (×3): qty 2

## 2019-05-31 MED ORDER — GLYCOPYRROLATE PF 0.2 MG/ML IJ SOSY
PREFILLED_SYRINGE | INTRAMUSCULAR | Status: DC | PRN
  Administered 2019-05-31: .1 mg via INTRAVENOUS

## 2019-05-31 MED ORDER — TRAMADOL HCL 50 MG PO TABS
ORAL_TABLET | ORAL | Status: AC
  Filled 2019-05-31: qty 2

## 2019-05-31 MED ORDER — METOCLOPRAMIDE HCL 5 MG/ML IJ SOLN: 5.0000 mg | Freq: Three times a day (TID) | INTRAMUSCULAR | Status: DC | PRN

## 2019-05-31 MED ORDER — FENTANYL CITRATE (PF) 100 MCG/2ML IJ SOLN
INTRAMUSCULAR | Status: AC
  Filled 2019-05-31: qty 4

## 2019-05-31 MED ORDER — ONDANSETRON HCL 4 MG/2ML IJ SOLN: 4.0000 mg | Freq: Four times a day (QID) | INTRAMUSCULAR | Status: DC | PRN

## 2019-05-31 MED ORDER — BISACODYL 10 MG RE SUPP: 10.0000 mg | Freq: Every day | RECTAL | Status: DC | PRN

## 2019-05-31 MED ORDER — ALBUTEROL SULFATE (2.5 MG/3ML) 0.083% IN NEBU
2.5000 mg | INHALATION_SOLUTION | Freq: Four times a day (QID) | RESPIRATORY_TRACT | Status: DC | PRN
  Filled 2019-05-31: qty 0.5

## 2019-05-31 MED ORDER — ACETAMINOPHEN 500 MG PO TABS
1000.0000 mg | ORAL_TABLET | Freq: Four times a day (QID) | ORAL | Status: AC
  Administered 2019-05-31 – 2019-06-01 (×4): 1000 mg via ORAL
  Filled 2019-05-31 (×4): qty 2

## 2019-05-31 MED ORDER — CYCLOSPORINE 0.05 % OP EMUL
1.0000 [drp] | Freq: Two times a day (BID) | OPHTHALMIC | Status: DC
  Administered 2019-05-31 – 2019-06-01 (×3): 1 [drp] via OPHTHALMIC
  Filled 2019-05-31 (×4): qty 30

## 2019-05-31 MED ORDER — ONDANSETRON HCL 4 MG PO TABS: 4.0000 mg | ORAL_TABLET | Freq: Four times a day (QID) | ORAL | Status: DC | PRN

## 2019-05-31 MED ORDER — BUPIVACAINE HCL (PF) 0.25 % IJ SOLN
INTRAMUSCULAR | Status: DC | PRN
  Administered 2019-05-31: 30 mL

## 2019-05-31 MED ORDER — PROPOFOL 10 MG/ML IV BOLUS
INTRAVENOUS | Status: AC
  Filled 2019-05-31: qty 60

## 2019-05-31 MED ORDER — TRANEXAMIC ACID-NACL 1000-0.7 MG/100ML-% IV SOLN
1000.0000 mg | INTRAVENOUS | Status: AC
  Administered 2019-05-31: 1000 mg via INTRAVENOUS
  Filled 2019-05-31: qty 100

## 2019-05-31 MED ORDER — SUGAMMADEX SODIUM 200 MG/2ML IV SOLN
INTRAVENOUS | Status: DC | PRN
  Administered 2019-05-31: 200 mg via INTRAVENOUS

## 2019-05-31 MED ORDER — METHOCARBAMOL 500 MG PO TABS
500.0000 mg | ORAL_TABLET | Freq: Four times a day (QID) | ORAL | Status: DC | PRN
  Administered 2019-05-31 – 2019-06-01 (×2): 500 mg via ORAL
  Filled 2019-05-31 (×3): qty 1

## 2019-05-31 MED ORDER — PANTOPRAZOLE SODIUM 40 MG PO TBEC
80.0000 mg | DELAYED_RELEASE_TABLET | Freq: Every day | ORAL | Status: DC
  Administered 2019-06-01: 10:00:00 80 mg via ORAL
  Filled 2019-05-31: qty 2

## 2019-05-31 MED ORDER — DOCUSATE SODIUM 100 MG PO CAPS
100.0000 mg | ORAL_CAPSULE | Freq: Two times a day (BID) | ORAL | Status: DC
  Administered 2019-05-31 – 2019-06-01 (×2): 100 mg via ORAL
  Filled 2019-05-31 (×2): qty 1

## 2019-05-31 MED ORDER — METOPROLOL TARTRATE 25 MG PO TABS
25.0000 mg | ORAL_TABLET | Freq: Once | ORAL | Status: AC
  Administered 2019-05-31: 25 mg via ORAL
  Filled 2019-05-31: qty 1

## 2019-05-31 MED ORDER — LACTATED RINGERS IV SOLN
INTRAVENOUS | Status: DC
  Administered 2019-05-31 (×2): via INTRAVENOUS

## 2019-05-31 MED ORDER — METHOCARBAMOL 500 MG IVPB - SIMPLE MED
INTRAVENOUS | Status: AC
  Filled 2019-05-31: qty 50

## 2019-05-31 MED ORDER — ONDANSETRON HCL 4 MG/2ML IJ SOLN
INTRAMUSCULAR | Status: DC | PRN
  Administered 2019-05-31 (×2): 4 mg via INTRAVENOUS

## 2019-05-31 MED ORDER — 0.9 % SODIUM CHLORIDE (POUR BTL) OPTIME
TOPICAL | Status: DC | PRN
  Administered 2019-05-31: 11:00:00 1000 mL

## 2019-05-31 MED ORDER — HYDROMORPHONE HCL 1 MG/ML IJ SOLN
INTRAMUSCULAR | Status: AC
  Filled 2019-05-31: qty 1

## 2019-05-31 MED ORDER — BUPIVACAINE HCL (PF) 0.25 % IJ SOLN
INTRAMUSCULAR | Status: AC
  Filled 2019-05-31: qty 30

## 2019-05-31 MED ORDER — LIDOCAINE 2% (20 MG/ML) 5 ML SYRINGE
INTRAMUSCULAR | Status: DC | PRN
  Administered 2019-05-31: 60 mg via INTRAVENOUS

## 2019-05-31 MED ORDER — KETOROLAC TROMETHAMINE 15 MG/ML IJ SOLN
15.0000 mg | Freq: Once | INTRAMUSCULAR | Status: AC
  Administered 2019-05-31: 15 mg via INTRAVENOUS

## 2019-05-31 MED ORDER — PROMETHAZINE HCL 25 MG/ML IJ SOLN: 6.2500 mg | INTRAMUSCULAR | Status: DC | PRN

## 2019-05-31 MED ORDER — POLYETHYLENE GLYCOL 3350 17 G PO PACK: 17.0000 g | PACK | Freq: Every day | ORAL | Status: DC | PRN

## 2019-05-31 MED ORDER — PHENOL 1.4 % MT LIQD
1.0000 | OROMUCOSAL | Status: DC | PRN
  Filled 2019-05-31: qty 177

## 2019-05-31 MED ORDER — GLYCOPYRROLATE PF 0.2 MG/ML IJ SOSY
PREFILLED_SYRINGE | INTRAMUSCULAR | Status: AC
  Filled 2019-05-31: qty 1

## 2019-05-31 MED ORDER — PHENYLEPHRINE 40 MCG/ML (10ML) SYRINGE FOR IV PUSH (FOR BLOOD PRESSURE SUPPORT)
PREFILLED_SYRINGE | INTRAVENOUS | Status: AC
  Filled 2019-05-31: qty 10

## 2019-05-31 MED ORDER — CEFAZOLIN SODIUM-DEXTROSE 1-4 GM/50ML-% IV SOLN
1.0000 g | Freq: Four times a day (QID) | INTRAVENOUS | Status: AC
  Administered 2019-05-31 (×2): 1 g via INTRAVENOUS
  Filled 2019-05-31 (×2): qty 50

## 2019-05-31 MED ORDER — OXYCODONE HCL 5 MG PO TABS
ORAL_TABLET | ORAL | Status: AC
  Filled 2019-05-31: qty 1

## 2019-05-31 MED ORDER — METHOCARBAMOL 500 MG IVPB - SIMPLE MED
500.0000 mg | Freq: Four times a day (QID) | INTRAVENOUS | Status: DC | PRN
  Administered 2019-05-31: 12:00:00 500 mg via INTRAVENOUS
  Filled 2019-05-31: qty 50

## 2019-05-31 MED ORDER — DEXAMETHASONE SODIUM PHOSPHATE 10 MG/ML IJ SOLN
8.0000 mg | Freq: Once | INTRAMUSCULAR | Status: AC
  Administered 2019-05-31: 10 mg via INTRAVENOUS

## 2019-05-31 MED ORDER — POVIDONE-IODINE 10 % EX SWAB
2.0000 "application " | Freq: Once | CUTANEOUS | Status: AC
  Administered 2019-05-31: 2 via TOPICAL

## 2019-05-31 MED ORDER — DEXAMETHASONE SODIUM PHOSPHATE 10 MG/ML IJ SOLN
10.0000 mg | Freq: Once | INTRAMUSCULAR | Status: AC
  Administered 2019-06-01: 10:00:00 10 mg via INTRAVENOUS
  Filled 2019-05-31: qty 1

## 2019-05-31 MED ORDER — METOCLOPRAMIDE HCL 5 MG PO TABS: 5.0000 mg | ORAL_TABLET | Freq: Three times a day (TID) | ORAL | Status: DC | PRN

## 2019-05-31 MED ORDER — MEPERIDINE HCL 50 MG/ML IJ SOLN: 6.2500 mg | INTRAMUSCULAR | Status: DC | PRN

## 2019-05-31 MED ORDER — TRAMADOL HCL 50 MG PO TABS: 50.0000 mg | ORAL_TABLET | Freq: Four times a day (QID) | ORAL | Status: DC | PRN

## 2019-05-31 MED ORDER — ROCURONIUM BROMIDE 10 MG/ML (PF) SYRINGE
PREFILLED_SYRINGE | INTRAVENOUS | Status: DC | PRN
  Administered 2019-05-31: 50 mg via INTRAVENOUS

## 2019-05-31 MED ORDER — ASPIRIN EC 325 MG PO TBEC
325.0000 mg | DELAYED_RELEASE_TABLET | Freq: Two times a day (BID) | ORAL | Status: DC
  Administered 2019-06-01: 10:00:00 325 mg via ORAL
  Filled 2019-05-31: qty 1

## 2019-05-31 MED ORDER — KETOROLAC TROMETHAMINE 30 MG/ML IJ SOLN
INTRAMUSCULAR | Status: AC
  Filled 2019-05-31: qty 1

## 2019-05-31 MED ORDER — ALBUTEROL SULFATE HFA 108 (90 BASE) MCG/ACT IN AERS: 1.0000 | INHALATION_SPRAY | Freq: Four times a day (QID) | RESPIRATORY_TRACT | Status: DC | PRN

## 2019-05-31 MED ORDER — SODIUM CHLORIDE 0.9 % IV SOLN
INTRAVENOUS | Status: DC
  Administered 2019-05-31: 16:00:00 via INTRAVENOUS

## 2019-05-31 MED ORDER — FLEET ENEMA 7-19 GM/118ML RE ENEM: 1.0000 | ENEMA | Freq: Once | RECTAL | Status: DC | PRN

## 2019-05-31 MED ORDER — HYDROMORPHONE HCL 1 MG/ML IJ SOLN
0.5000 mg | INTRAMUSCULAR | Status: DC | PRN
  Administered 2019-05-31: 13:00:00 0.5 mg via INTRAVENOUS

## 2019-05-31 MED ORDER — PROPOFOL 10 MG/ML IV BOLUS
INTRAVENOUS | Status: AC
  Filled 2019-05-31: qty 20

## 2019-05-31 MED ORDER — CHLORHEXIDINE GLUCONATE 4 % EX LIQD: 60.0000 mL | Freq: Once | CUTANEOUS | Status: DC

## 2019-05-31 MED ORDER — FENTANYL CITRATE (PF) 100 MCG/2ML IJ SOLN
INTRAMUSCULAR | Status: DC | PRN
  Administered 2019-05-31 (×2): 50 ug via INTRAVENOUS

## 2019-05-31 MED ORDER — CEFAZOLIN SODIUM-DEXTROSE 2-4 GM/100ML-% IV SOLN
2.0000 g | INTRAVENOUS | Status: AC
  Administered 2019-05-31: 2 g via INTRAVENOUS
  Filled 2019-05-31: qty 100

## 2019-05-31 MED ORDER — ACETAMINOPHEN 10 MG/ML IV SOLN
1000.0000 mg | Freq: Four times a day (QID) | INTRAVENOUS | Status: DC
  Administered 2019-05-31: 1000 mg via INTRAVENOUS
  Filled 2019-05-31: qty 100

## 2019-05-31 MED ORDER — STERILE WATER FOR IRRIGATION IR SOLN
Status: DC | PRN
  Administered 2019-05-31: 2000 mL

## 2019-05-31 MED ORDER — MENTHOL 3 MG MT LOZG: 1.0000 | LOZENGE | OROMUCOSAL | Status: DC | PRN

## 2019-05-31 MED ORDER — METOPROLOL TARTRATE 25 MG PO TABS
25.0000 mg | ORAL_TABLET | Freq: Two times a day (BID) | ORAL | Status: DC
  Administered 2019-05-31: 22:00:00 25 mg via ORAL
  Filled 2019-05-31 (×2): qty 1

## 2019-05-31 MED ORDER — OXYCODONE HCL 5 MG PO TABS
5.0000 mg | ORAL_TABLET | ORAL | Status: DC | PRN
  Administered 2019-05-31: 14:00:00 5 mg via ORAL
  Administered 2019-05-31: 10 mg via ORAL
  Administered 2019-05-31: 5 mg via ORAL
  Administered 2019-06-01 (×2): 10 mg via ORAL
  Filled 2019-05-31 (×2): qty 2
  Filled 2019-05-31: qty 1
  Filled 2019-05-31: qty 2

## 2019-05-31 MED ORDER — PROPOFOL 10 MG/ML IV BOLUS
INTRAVENOUS | Status: DC | PRN
  Administered 2019-05-31: 140 mg via INTRAVENOUS

## 2019-05-31 MED ORDER — ROSUVASTATIN CALCIUM 20 MG PO TABS
40.0000 mg | ORAL_TABLET | Freq: Every day | ORAL | Status: DC
  Administered 2019-06-01: 40 mg via ORAL
  Filled 2019-05-31: qty 2

## 2019-05-31 SURGICAL SUPPLY — 45 items
BAG DECANTER FOR FLEXI CONT (MISCELLANEOUS) IMPLANT
BAG ZIPLOCK 12X15 (MISCELLANEOUS) IMPLANT
BLADE SAG 18X100X1.27 (BLADE) ×3 IMPLANT
CLOSURE STERI-STRIP 1/2X4 (GAUZE/BANDAGES/DRESSINGS) ×2
CLOSURE WOUND 1/2 X4 (GAUZE/BANDAGES/DRESSINGS) ×1
CLSR STERI-STRIP ANTIMIC 1/2X4 (GAUZE/BANDAGES/DRESSINGS) ×4 IMPLANT
COVER PERINEAL POST (MISCELLANEOUS) ×3 IMPLANT
COVER SURGICAL LIGHT HANDLE (MISCELLANEOUS) ×3 IMPLANT
COVER WAND RF STERILE (DRAPES) ×3 IMPLANT
CUP ACETBLR 48 OD SECTOR II (Hips) ×3 IMPLANT
DECANTER SPIKE VIAL GLASS SM (MISCELLANEOUS) ×3 IMPLANT
DRAPE STERI IOBAN 125X83 (DRAPES) ×3 IMPLANT
DRAPE U-SHAPE 47X51 STRL (DRAPES) ×6 IMPLANT
DRSG ADAPTIC 3X8 NADH LF (GAUZE/BANDAGES/DRESSINGS) ×3 IMPLANT
DRSG MEPILEX BORDER 4X4 (GAUZE/BANDAGES/DRESSINGS) ×3 IMPLANT
DRSG MEPILEX BORDER 4X8 (GAUZE/BANDAGES/DRESSINGS) ×3 IMPLANT
DURAPREP 26ML APPLICATOR (WOUND CARE) ×3 IMPLANT
ELECT REM PT RETURN 15FT ADLT (MISCELLANEOUS) ×3 IMPLANT
EVACUATOR 1/8 PVC DRAIN (DRAIN) ×3 IMPLANT
GLOVE BIO SURGEON STRL SZ 6 (GLOVE) IMPLANT
GLOVE BIO SURGEON STRL SZ7 (GLOVE) IMPLANT
GLOVE BIO SURGEON STRL SZ8 (GLOVE) ×3 IMPLANT
GLOVE BIOGEL PI IND STRL 6.5 (GLOVE) IMPLANT
GLOVE BIOGEL PI IND STRL 7.0 (GLOVE) IMPLANT
GLOVE BIOGEL PI IND STRL 8 (GLOVE) ×1 IMPLANT
GLOVE BIOGEL PI INDICATOR 6.5 (GLOVE)
GLOVE BIOGEL PI INDICATOR 7.0 (GLOVE)
GLOVE BIOGEL PI INDICATOR 8 (GLOVE) ×2
GOWN STRL REUS W/TWL LRG LVL3 (GOWN DISPOSABLE) ×3 IMPLANT
GOWN STRL REUS W/TWL XL LVL3 (GOWN DISPOSABLE) IMPLANT
HEAD CERAMIC DELTA 28 P1.5 HIP (Head) ×3 IMPLANT
KIT TURNOVER KIT A (KITS) IMPLANT
LINER MARATHON 28 48 (Hips) ×3 IMPLANT
MANIFOLD NEPTUNE II (INSTRUMENTS) ×3 IMPLANT
PACK ANTERIOR HIP CUSTOM (KITS) ×3 IMPLANT
STEM FEMORAL SZ5 HIGH ACTIS (Stem) ×3 IMPLANT
STRIP CLOSURE SKIN 1/2X4 (GAUZE/BANDAGES/DRESSINGS) ×2 IMPLANT
SUT ETHIBOND NAB CT1 #1 30IN (SUTURE) ×6 IMPLANT
SUT MNCRL AB 4-0 PS2 18 (SUTURE) ×3 IMPLANT
SUT STRATAFIX 0 PDS 27 VIOLET (SUTURE) ×3
SUT VIC AB 2-0 CT1 27 (SUTURE) ×4
SUT VIC AB 2-0 CT1 TAPERPNT 27 (SUTURE) ×2 IMPLANT
SUTURE STRATFX 0 PDS 27 VIOLET (SUTURE) ×1 IMPLANT
SYR 50ML LL SCALE MARK (SYRINGE) IMPLANT
YANKAUER SUCT BULB TIP 10FT TU (MISCELLANEOUS) ×3 IMPLANT

## 2019-05-31 NOTE — Op Note (Addendum)
OPERATIVE REPORT- TOTAL HIP ARTHROPLASTY   PREOPERATIVE DIAGNOSIS: Osteoarthritis of the Right hip.   POSTOPERATIVE DIAGNOSIS: Osteoarthritis of the Right  hip.   PROCEDURE: Right total hip arthroplasty, anterior approach.   SURGEON: Ollen GrossFrank Latrease Kunde, MD   ASSISTANT: Dennie BibleAshley Stinson, PA-C  ANESTHESIA:  General  ESTIMATED BLOOD LOSS:-300 mL    DRAINS: Hemovac x1.   COMPLICATIONS: None   CONDITION: PACU - hemodynamically stable.   BRIEF CLINICAL NOTE: Karen LaurenceLinda Woods is a 73 y.o. female who has advanced end-  stage arthritis of their Right  hip with progressively worsening pain and  dysfunction.The patient has failed nonoperative management and presents for  total hip arthroplasty.   PROCEDURE IN DETAIL: After successful administration of General anesthetic, the traction boots [facility] were placed on both  feet and the [facility] was [facility], boots placed into the leg  holders. The Right hip was then isolated from the perineum with plastic  drapes and prepped and draped in the usual sterile fashion. ASIS and  greater trochanter were marked and a oblique incision was made, starting  at about 1 cm lateral and 2 cm distal to the ASIS and coursing towards  the anterior cortex of the femur. The skin was cut with a 10 blade  through subcutaneous tissue to the level of the fascia overlying the  tensor fascia lata muscle. The fascia was then incised in line with the  incision at the junction of the anterior third and posterior 2/3rd. The  muscle was teased off the fascia and then the interval between the TFL  and the rectus was developed. The Hohmann retractor was then placed at  the top of the femoral neck over the capsule. The vessels overlying the  capsule were cauterized and the fat on top of the capsule was removed.  A Hohmann retractor was then placed anterior underneath the rectus  femoris to give exposure to the entire anterior capsule. A T-shaped   capsulotomy was performed. The edges were tagged and the femoral head  was identified.       Osteophytes are removed off the superior acetabulum.  The femoral neck was then cut in situ with an oscillating saw. Traction  was then applied to the left lower extremity utilizing the [facility]  traction. The femoral head was then removed. Retractors were placed  around the acetabulum and then circumferential removal of the labrum was  performed. Osteophytes were also removed. Reaming starts at 45 mm to  medialize and  Increased in 2 mm increments to 47 mm. We reamed in  approximately 40 degrees of abduction, 20 degrees anteversion. A 48 mm  pinnacle acetabular shell was then impacted in anatomic position under  fluoroscopic guidance with excellent purchase. We did not need to place  any additional dome screws. A 28 mm neutral + 4 marathon liner was then  placed into the acetabular shell.       The femoral lift was then placed along the lateral aspect of the femur  just distal to the vastus ridge. The leg was  externally rotated and capsule  was stripped off the inferior aspect of the femoral neck down to the  level of the lesser trochanter, this was done with electrocautery. The femur was lifted after this was performed. The  leg was then placed in an extended and adducted position essentially delivering the femur. We also removed the capsule superiorly and the piriformis from the piriformis fossa to  gain excellent exposure of the  proximal femur. Rongeur was used to remove some cancellous bone to get  into the lateral portion of the proximal femur for placement of the  initial starter reamer. The starter broaches was placed  the starter broach  and was shown to go down the center of the canal. Broaching  with the Actis system was then performed starting at size 0  coursing  Up to size 5. A size 5 had excellent torsional and rotational  and axial stability. The trial high offset neck was then placed   with a 28 + 1.5 trial head. The hip was then reduced. We confirmed that  the stem was in the canal both on AP and lateral x-rays. It also has excellent sizing. The hip was reduced with outstanding stability through full extension and full external rotation.. AP pelvis was taken and the leg lengths were measured and found to be equal. Hip was then dislocated again and the femoral head and neck removed. The  femoral broach was removed. Size 5 Actis stem with a high offset  neck was then impacted into the femur following native anteversion. Has  excellent purchase in the canal. Excellent torsional and rotational and  axial stability. It is confirmed to be in the canal on AP and lateral  fluoroscopic views. The 28 + 1.5 ceramic head was placed and the hip  reduced with outstanding stability. Again AP pelvis was taken and it  confirmed that the leg lengths were equal. The wound was then copiously  irrigated with saline solution and the capsule reattached and repaired  with Ethibond suture. 30 ml of .25% Bupivicaine was  injected into the capsule and into the edge of the tensor fascia lata as well as subcutaneous tissue. The fascia overlying the tensor fascia lata was then closed with a running #1 V-Loc. Subcu was closed with interrupted 2-0 Vicryl and subcuticular running 4-0 Monocryl. Incision was cleaned  and dried. Steri-Strips and a bulky sterile dressing applied. Hemovac  drain was hooked to suction and then the patient was awakened and transported to  recovery in stable condition.        Please note that a surgical assistant was a medical necessity for this procedure to perform it in a safe and expeditious manner. Assistant was necessary to provide appropriate retraction of vital neurovascular structures and to prevent femoral fracture and allow for anatomic placement of the prosthesis.  Gaynelle Arabian, M.D.

## 2019-05-31 NOTE — Discharge Instructions (Signed)
°Dr. Frank Aluisio °Total Joint Specialist °[facility] °[address] °[place], [place] [address] °[phone] ° °ANTERIOR APPROACH TOTAL HIP REPLACEMENT POSTOPERATIVE DIRECTIONS ° ° °Hip Rehabilitation, Guidelines Following Surgery  °The results of a hip operation are greatly improved after range of motion and muscle strengthening exercises. Follow all safety measures which are given to protect your hip. If any of these exercises cause increased pain or swelling in your joint, decrease the amount until you are comfortable again. Then slowly increase the exercises. Call your caregiver if you have problems or questions.  ° °HOME CARE INSTRUCTIONS  °• Remove items at home which could result in a fall. This includes throw rugs or furniture in walking pathways.  °· ICE to the affected hip every three hours for 30 minutes at a time and then as needed for pain and swelling.  Continue to use ice on the hip for pain and swelling from surgery. You may notice swelling that will progress down to the foot and ankle.  This is normal after surgery.  Elevate the leg when you are not up walking on it.   °· Continue to use the breathing machine which will help keep your temperature down.  It is common for your temperature to cycle up and down following surgery, especially at night when you are not up moving around and exerting yourself.  The breathing machine keeps your lungs expanded and your temperature down. ° °DIET °You may resume your previous home diet once your are discharged from the hospital. ° °DRESSING / WOUND CARE / SHOWERING °You may shower 3 days after surgery, but keep the wounds dry during showering.  You may use an occlusive plastic wrap (Press'n Seal for example), NO SOAKING/SUBMERGING IN THE BATHTUB.  If the bandage gets wet, change with a clean dry gauze.  If the incision gets wet, pat the wound dry with a clean towel. °You may start showering once you are discharged home but do not submerge the  incision under water. Just pat the incision dry and apply a dry gauze dressing on daily. °Change the surgical dressing daily and reapply a dry dressing each time. ° °ACTIVITY °Walk with your walker as instructed. °Use walker as long as suggested by your caregivers. °Avoid periods of inactivity such as sitting longer than an hour when not asleep. This helps prevent blood clots.  °You may resume a sexual relationship in one month or when given the OK by your doctor.  °You may return to work once you are cleared by your doctor.  °Do not drive a car for 6 weeks or until released by you surgeon.  °Do not drive while taking narcotics. ° °WEIGHT BEARING °Weight bearing as tolerated with assist device (walker, cane, etc) as directed, use it as long as suggested by your surgeon or therapist, typically at least 4-6 weeks. ° °POSTOPERATIVE CONSTIPATION PROTOCOL °Constipation - defined medically as fewer than three stools per week and severe constipation as less than one stool per week. ° °One of the most common issues patients have following surgery is constipation.  Even if you have a regular bowel pattern at home, your normal regimen is likely to be disrupted due to multiple reasons following surgery.  Combination of anesthesia, postoperative narcotics, change in appetite and fluid intake all can affect your bowels.  In order to avoid complications following surgery, here are some recommendations in order to help you during your recovery period. ° °Colace (docusate) - Pick up an over-the-counter form   of Colace or another stool softener and take twice a day as long as you are requiring postoperative pain medications.  Take with a full glass of water daily.  If you experience loose stools or diarrhea, hold the colace until you stool forms back up.  If your symptoms do not get better within 1 week or if they get worse, check with your doctor. ° °Dulcolax (bisacodyl) - Pick up over-the-counter and take as directed by the product  packaging as needed to assist with the movement of your bowels.  Take with a full glass of water.  Use this product as needed if not relieved by Colace only.  ° °MiraLax (polyethylene glycol) - Pick up over-the-counter to have on hand.  MiraLax is a solution that will increase the amount of water in your bowels to assist with bowel movements.  Take as directed and can mix with a glass of water, juice, soda, coffee, or tea.  Take if you go more than two days without a movement. °Do not use MiraLax more than once per day. Call your doctor if you are still constipated or irregular after using this medication for 7 days in a row. ° °If you continue to have problems with postoperative constipation, please contact the office for further assistance and recommendations.  If you experience "the worst abdominal pain ever" or develop nausea or vomiting, please contact the office immediatly for further recommendations for treatment. ° °ITCHING ° If you experience itching with your medications, try taking only a single pain pill, or even half a pain pill at a time.  You can also use Benadryl over the counter for itching or also to help with sleep.  ° °TED HOSE STOCKINGS °Wear the elastic stockings on both legs for three weeks following surgery during the day but you may remove then at night for sleeping. ° °MEDICATIONS °See your medication summary on the “After Visit Summary” that the nursing staff will review with you prior to discharge.  You may have some home medications which will be placed on hold until you complete the course of blood thinner medication.  It is important for you to complete the blood thinner medication as prescribed by your surgeon.  Continue your approved medications as instructed at time of discharge. ° °PRECAUTIONS °If you experience chest pain or shortness of breath - call 911 immediately for transfer to the hospital emergency department.  °If you develop a fever greater that 101 F, purulent drainage  from wound, increased redness or drainage from wound, foul odor from the wound/dressing, or calf pain - CONTACT YOUR SURGEON.   °                                                °FOLLOW-UP APPOINTMENTS °Make sure you keep all of your appointments after your operation with your surgeon and caregivers. You should call the office at the above phone number and make an appointment for approximately two weeks after the date of your surgery or on the date instructed by your surgeon outlined in the "After Visit Summary". ° °RANGE OF MOTION AND STRENGTHENING EXERCISES  °These exercises are designed to help you keep full movement of your hip joint. Follow your caregiver's or physical therapist's instructions. Perform all exercises about fifteen times, three times per day or as directed. Exercise both hips, even if you have   had only one joint replacement. These exercises can be done on a training (exercise) mat, on the floor, on a table or on a bed. Use whatever works the best and is most comfortable for you. Use music or television while you are exercising so that the exercises are a pleasant break in your day. This will make your life better with the exercises acting as a break in routine you can look forward to.  °• Lying on your back, slowly slide your foot toward your buttocks, raising your knee up off the floor. Then slowly slide your foot back down until your leg is straight again.  °• Lying on your back spread your legs as far apart as you can without causing discomfort.  °• Lying on your side, raise your upper leg and foot straight up from the floor as far as is comfortable. Slowly lower the leg and repeat.  °• Lying on your back, tighten up the muscle in the front of your thigh (quadriceps muscles). You can do this by keeping your leg straight and trying to raise your heel off the floor. This helps strengthen the largest muscle supporting your knee.  °• Lying on your back, tighten up the muscles of your buttocks both  with the legs straight and with the knee bent at a comfortable angle while keeping your heel on the floor.  ° °IF YOU ARE TRANSFERRED TO A SKILLED REHAB FACILITY °If the patient is transferred to a skilled rehab facility following release from the hospital, a list of the current medications will be sent to the facility for the patient to continue.  When discharged from the skilled rehab facility, please have the facility set up the patient's Home Health Physical Therapy prior to being released. Also, the skilled facility will be responsible for providing the patient with their medications at time of release from the facility to include their pain medication, the muscle relaxants, and their blood thinner medication. If the patient is still at the rehab facility at time of the two week follow up appointment, the skilled rehab facility will also need to assist the patient in arranging follow up appointment in our office and any transportation needs. ° °MAKE SURE YOU:  °• Understand these instructions.  °• Get help right away if you are not doing well or get worse.  ° ° °Pick up stool softner and laxative for home use following surgery while on pain medications. °Do not submerge incision under water. °Please use good hand washing techniques while changing dressing each day. °May shower starting three days after surgery. °Please use a clean towel to pat the incision dry following showers. °Continue to use ice for pain and swelling after surgery. °Do not use any lotions or creams on the incision until instructed by your surgeon. ° °

## 2019-05-31 NOTE — Transfer of Care (Signed)
Immediate Anesthesia Transfer of Care Note  Patient: Karen Woods  Procedure(s) Performed: TOTAL HIP ARTHROPLASTY ANTERIOR APPROACH (Right Hip)  Patient Location: PACU  Anesthesia Type:General  Level of Consciousness: awake, alert  and oriented  Airway & Oxygen Therapy: Patient Spontanous Breathing and Patient connected to face mask oxygen  Post-op Assessment: Report given to RN and Post -op Vital signs reviewed and stable  Post vital signs: Reviewed and stable  Last Vitals:  Vitals Value Taken Time  BP 156/72 05/31/19 1204  Temp    Pulse 57 05/31/19 1209  Resp 14 05/31/19 1209  SpO2 100 % 05/31/19 1209  Vitals shown include unvalidated device data.  Last Pain:  Vitals:   05/31/19 0855  TempSrc: Oral         Complications: No apparent anesthesia complications

## 2019-05-31 NOTE — Anesthesia Procedure Notes (Signed)
Procedure Name: Intubation Date/Time: 05/31/2019 10:35 AM Performed by: [place] Naraly Fritcher C, CRNA Pre-anesthesia Checklist: Patient identified, Emergency Drugs available, Suction available and Patient being monitored Patient Re-evaluated:Patient Re-evaluated prior to induction Oxygen Delivery Method: Circle system utilized Preoxygenation: Pre-oxygenation with 100% oxygen Induction Type: IV induction Ventilation: Mask ventilation without difficulty Laryngoscope Size: Mac and 3 Tube type: Oral Tube size: 7.0 mm Number of attempts: 1 Airway Equipment and Method: Stylet and Oral airway Placement Confirmation: ETT inserted through vocal cords under direct vision,  positive ETCO2 and breath sounds checked- equal and bilateral Secured at: 20 cm Tube secured with: Tape Dental Injury: Teeth and Oropharynx as per pre-operative assessment

## 2019-05-31 NOTE — Evaluation (Signed)
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  Clinical Impression  Pt is s/p DA THA resulting in the deficits listed below (see PT Problem List).  Pt will benefit from acute PT to increase their independence and safety with mobility to allow discharge home tomorrow as her goal. .       Follow Up Recommendations Follow surgeon's recommendation for DC plan and follow-up therapies(likley will progress well with no PT f/u needed)    Equipment Recommendations  None recommended by PT    Recommendations for Other Services       Precautions / Restrictions Precautions Precautions: None Restrictions Weight Bearing Restrictions: No      Mobility  Bed Mobility Overal bed mobility: Needs Assistance Bed Mobility: Supine to Sit;Sit to Supine     Supine to sit: Min assist Sit to supine: Min assist   General bed mobility comments: Min Assist with upper body.  Transfers Overall transfer level: Needs assistance Equipment used: Rolling walker (2 wheeled) Transfers: Sit to/from Stand Sit to Stand: Min guard         General transfer comment: cues for hand placement safety due to pt feeling a little "woozy" from pain meds and little implusive with quick movements  Ambulation/Gait Ambulation/Gait assistance: Min guard Gait Distance (Feet): 5 Feet Assistive device: Rolling walker (2 wheeled) Gait Pattern/deviations: Step-to pattern     General Gait Details: limited distance due to pt stated she gelt "woozy " from surgery adn pian meds, however moved wvery well , slightly impulsive and quick  Stairs            Wheelchair Mobility    Modified Rankin (Stroke Patients Only)       Balance                                             Pertinent Vitals/Pain Pain  Assessment: No/denies pain    Home Living Family/patient expects to be discharged to:: Private residence Living Arrangements: Alone Available Help at Discharge: Family Type of Home: Mobile home Home Access: Stairs to enter Entrance Stairs-Rails: Right Entrance Stairs-Number of Steps: 3 Home Layout: One level Home Equipment: Walker - 2 wheels;Toilet riser;Shower seat      Prior Function Level of Independence: Independent               Hand Dominance        Extremity/Trunk Assessment        Lower Extremity Assessment Lower Extremity Assessment: Overall WFL for tasks assessed(pt moved RLE suprisingly very well, actually told he to be careful and minimize how much she is moving it today escpecially in the extreme ROM she was moving it.)       Communication   Communication: No difficulties  Cognition Arousal/Alertness: Awake/alert Behavior During Therapy: WFL for tasks assessed/performed Overall Cognitive Status: Within Functional Limits for tasks assessed                                        General Comments      Exercises     Assessment/Plan    PT Assessment Patient needs  continued PT services  PT Problem List Decreased strength;Decreased range of motion;Decreased activity tolerance;Decreased mobility       PT Treatment Interventions Gait training;Stair training;Functional mobility training;Therapeutic activities;Therapeutic exercise;Patient/family education    PT Goals (Current goals can be found in the Care Plan section)  Acute Rehab PT Goals Patient Stated Goal: I am going home tomorrow !! PT Goal Formulation: With patient Time For Goal Achievement: 06/07/19 Potential to Achieve Goals: Good    Frequency 7X/week   Barriers to discharge        Co-evaluation               AM-PAC PT "6 Clicks" Mobility  Outcome Measure Help needed turning from your back to your side while in a flat bed without using bedrails?: A  Little Help needed moving from lying on your back to sitting on the side of a flat bed without using bedrails?: A Little Help needed moving to and from a bed to a chair (including a wheelchair)?: A Little Help needed standing up from a chair using your arms (e.g., wheelchair or bedside chair)?: A Little Help needed to walk in hospital room?: A Little Help needed climbing 3-5 steps with a railing? : A Little 6 Click Score: 18    End of Session Equipment Utilized During Treatment: Gait belt Activity Tolerance: Patient tolerated treatment well Patient left: in chair;with call bell/phone within reach;with chair alarm set Nurse Communication: Mobility status PT Visit Diagnosis: Other abnormalities of gait and mobility (R26.89);Difficulty in walking, not elsewhere classified (R26.2)    Time: 9629-5284 PT Time Calculation (min) (ACUTE ONLY): 24 min   Charges:   PT Evaluation $PT Eval Low Complexity: 1 Low PT Treatments $Therapeutic Activity: 8-22 mins        Clide Dales, PT Acute Rehabilitation Services Pager: [phone] Office: [phone] 05/31/2019   Clide Dales 05/31/2019, 8:43 PM

## 2019-05-31 NOTE — Interval H&P Note (Signed)
History and Physical Interval Note:  05/31/2019 9:29 AM  Karen Woods  has presented today for surgery, with the diagnosis of right hip osteoarthritis.  The various methods of treatment have been discussed with the patient and family. After consideration of risks, benefits and other options for treatment, the patient has consented to  Procedure(s) with comments: Izard (Right) - 17min as a surgical intervention.  The patient's history has been reviewed, patient examined, no change in status, stable for surgery.  I have reviewed the patient's chart and labs.  Questions were answered to the patient's satisfaction.     Pilar Plate Hallie Ishida

## 2019-06-01 ENCOUNTER — Encounter (HOSPITAL_COMMUNITY): Payer: Self-pay

## 2019-06-01 LAB — CBC
HCT: 30.9 % — ABNORMAL LOW (ref 36.0–46.0)
Hemoglobin: 9.9 g/dL — ABNORMAL LOW (ref 12.0–15.0)
MCH: 28.8 pg (ref 26.0–34.0)
MCHC: 32 g/dL (ref 30.0–36.0)
MCV: 89.8 fL (ref 80.0–100.0)
Platelets: 180 10*3/uL (ref 150–400)
RBC: 3.44 MIL/uL — ABNORMAL LOW (ref 3.87–5.11)
RDW: 14.5 % (ref 11.5–15.5)
WBC: 10.9 10*3/uL — ABNORMAL HIGH (ref 4.0–10.5)
nRBC: 0 % (ref 0.0–0.2)

## 2019-06-01 LAB — BASIC METABOLIC PANEL
Anion gap: 9 (ref 5–15)
BUN: 13 mg/dL (ref 8–23)
CO2: 25 mmol/L (ref 22–32)
Calcium: 8.3 mg/dL — ABNORMAL LOW (ref 8.9–10.3)
Chloride: 104 mmol/L (ref 98–111)
Creatinine, Ser: 0.72 mg/dL (ref 0.44–1.00)
GFR calc Af Amer: >60 mL/min (ref >60)
GFR calc non Af Amer: >60 mL/min (ref >60)
Glucose, Bld: 128 mg/dL — ABNORMAL HIGH (ref 70–99)
Potassium: 4.4 mmol/L (ref 3.5–5.1)
Sodium: 138 mmol/L (ref 135–145)

## 2019-06-01 MED ORDER — TRAMADOL HCL 50 MG PO TABS: 50.0000 mg | ORAL_TABLET | Freq: Four times a day (QID) | ORAL | 0 refills | Status: DC | PRN

## 2019-06-01 MED ORDER — OXYCODONE HCL 5 MG PO TABS: 5.0000 mg | ORAL_TABLET | ORAL | 0 refills | Status: DC | PRN

## 2019-06-01 MED ORDER — METHOCARBAMOL 500 MG PO TABS: 500.0000 mg | ORAL_TABLET | Freq: Four times a day (QID) | ORAL | 0 refills | Status: DC | PRN

## 2019-06-01 MED ORDER — ASPIRIN 325 MG PO TBEC: 325.0000 mg | DELAYED_RELEASE_TABLET | Freq: Two times a day (BID) | ORAL | 0 refills | Status: DC

## 2019-06-01 NOTE — Discharge Summary (Signed)
Physician Discharge Summary   Patient ID: Karen Woods MRN: [id number] DOB/AGE: 24-Aug-1946 73 y.o.  Admit date: 05/31/2019 Discharge date: 06/01/2019  Primary Diagnosis: Primary osteoarthritis right hip  Admission Diagnoses:  Past Medical History:  Diagnosis Date  . Arthritis   . Back pain   . COPD (chronic obstructive pulmonary disease) (HCC)    Mild  . Coronary artery disease   . Depression   . GERD (gastroesophageal reflux disease)   . Heart murmur   . History of aortic stenosis    Severe  . History of prolapse of bladder   . Hyperlipidemia   . Hypertension   . Osteoporosis   . Pneumonia   . PONV (postoperative nausea and vomiting)   . Pre-diabetes   . Small vessel disease, cerebrovascular   . Vertebrobasilar artery insufficiency   . Vertigo    Discharge Diagnoses:   Active Problems:   OA (osteoarthritis) of hip  Estimated body mass index is 26.57 kg/m as calculated from the following:   Height as of this encounter: 5\' 3"  (1.6 m).   Weight as of this encounter: 68 kg.  Procedure(s) (LRB): TOTAL HIP ARTHROPLASTY ANTERIOR APPROACH (Right)   Consults: None  HPI: Karen Woods, 73 y.o. female, has a history of pain and functional disability in the right hip(s) due to arthritis and patient has failed non-surgical conservative treatments for greater than 12 weeks to include corticosteriod injections and activity modification.  Onset of symptoms was gradual starting 1 years ago with gradually worsening course since that time.The patient noted no past surgery on the right hip(s).  Patient currently rates pain in the right hip at 9 out of 10 with activity. Patient has night pain, worsening of pain with activity and weight bearing and pain that interfers with activities of daily living. 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 This condition presents safety issues increasing the risk of falls.There is no current active  infection.  Laboratory Data: Admission on 05/31/2019, Discharged on 06/01/2019  Component Date Value Ref Range Status  . WBC 06/01/2019 10.9* 4.0 - 10.5 K/uL Final  . RBC 06/01/2019 3.44* 3.87 - 5.11 MIL/uL Final  . Hemoglobin 06/01/2019 9.9* 12.0 - 15.0 g/dL Final  . HCT 78/29/5621 30.9* 36.0 - 46.0 % Final  . MCV 06/01/2019 89.8  80.0 - 100.0 fL Final  . MCH 06/01/2019 28.8  26.0 - 34.0 pg Final  . MCHC 06/01/2019 32.0  30.0 - 36.0 g/dL Final  . RDW 30/86/5784 14.5  11.5 - 15.5 % Final  . Platelets 06/01/2019 180  150 - 400 K/uL Final  . nRBC 06/01/2019 0.0  0.0 - 0.2 % Final   Performed at [facility], [address] [place], [place] [address]  . Sodium 06/01/2019 138  135 - 145 mmol/L Final  . Potassium 06/01/2019 4.4  3.5 - 5.1 mmol/L Final  . Chloride 06/01/2019 104  98 - 111 mmol/L Final  . CO2 06/01/2019 25  22 - 32 mmol/L Final  . Glucose, Bld 06/01/2019 128* 70 - 99 mg/dL Final  . BUN 52/84/1324 13  8 - 23 mg/dL Final  . Creatinine, Ser 06/01/2019 0.72  0.44 - 1.00 mg/dL Final  . Calcium 40/06/2724 8.3* 8.9 - 10.3 mg/dL Final  . GFR calc non Af Amer 06/01/2019 >60  >60 mL/min Final  . GFR calc Af Amer 06/01/2019 >60  >60 mL/min Final  . Anion gap 06/01/2019 9  5 - 15 Final  Performed at [facility], [place] [address], [place], [place] [address]  Hospital Outpatient Visit on 05/27/2019  Component Date Value Ref Range Status  . SARS-CoV-2, NAA 05/27/2019 NOT DETECTED  NOT DETECTED Final   Comment: (NOTE) This nucleic acid amplification test was developed and its performance characteristics determined by [employer]. Nucleic acid amplification tests include PCR and TMA. This test has not been FDA cleared or approved. This test has been authorized by FDA under an Emergency Use Authorization (EUA). This test is only authorized for the duration of time the declaration that circumstances exist justifying the authorization of the  emergency use of in vitro diagnostic tests for detection of SARS-CoV-2 virus and/or diagnosis of COVID-19 infection under section 564(b)(1) of the Act, 21 U.S.C. 742VZD-6(L) (1), unless the authorization is terminated or revoked sooner. When diagnostic testing is negative, the possibility of a false negative result should be considered in the context of a patient's recent exposures and the presence of clinical signs and symptoms consistent with COVID-19. An individual without symptoms of COVID- 19 and who is not shedding SARS-CoV-2 vi                          rus would expect to have a negative (not detected) result in this assay. Performed At: [facility] [place], [place] [id number] Rush Farmer MD JJ:[id number]   . Coronavirus Source 05/27/2019 NASOPHARYNGEAL   Final   Performed at [facility], [place] [address] [place], [place] [address]  Hospital Outpatient Visit on 05/26/2019  Component Date Value Ref Range Status  . aPTT 05/26/2019 26  24 - 36 seconds Final   Performed at [facility], [place] [address], [place], [place] [address]  . WBC 05/26/2019 9.5  4.0 - 10.5 K/uL Final  . RBC 05/26/2019 4.63  3.87 - 5.11 MIL/uL Final  . Hemoglobin 05/26/2019 13.2  12.0 - 15.0 g/dL Final  . HCT 05/26/2019 41.9  36.0 - 46.0 % Final  . MCV 05/26/2019 90.5  80.0 - 100.0 fL Final  . MCH 05/26/2019 28.5  26.0 - 34.0 pg Final  . MCHC 05/26/2019 31.5  30.0 - 36.0 g/dL Final  . RDW 05/26/2019 14.6  11.5 - 15.5 % Final  . Platelets 05/26/2019 267  150 - 400 K/uL Final  . nRBC 05/26/2019 0.0  0.0 - 0.2 % Final   Performed at [facility], [place] [address] [place], [place] [address]  . Sodium 05/26/2019 139  135 - 145 mmol/L Final  . Potassium 05/26/2019 4.1  3.5 - 5.1 mmol/L Final  . Chloride 05/26/2019 104  98 - 111 mmol/L Final  . CO2 05/26/2019 27  22 - 32 mmol/L Final  . Glucose, Bld 05/26/2019 70  70 - 99 mg/dL Final  . BUN  05/26/2019 17  8 - 23 mg/dL Final  . Creatinine, Ser 05/26/2019 0.61  0.44 - 1.00 mg/dL Final  . Calcium 05/26/2019 9.3  8.9 - 10.3 mg/dL Final  . Total Protein 05/26/2019 7.6  6.5 - 8.1 g/dL Final  . Albumin 05/26/2019 4.4  3.5 - 5.0 g/dL Final  . AST 05/26/2019 20  15 - 41 U/L Final  . ALT 05/26/2019 20  0 - 44 U/L Final  . Alkaline Phosphatase 05/26/2019 71  38 - 126 U/L Final  . Total Bilirubin 05/26/2019 1.4* 0.3 - 1.2 mg/dL Final  . GFR calc non Af Amer 05/26/2019 >60  >  60 mL/min Final  . GFR calc Af Amer 05/26/2019 >60  >60 mL/min Final  . Anion gap 05/26/2019 8  5 - 15 Final   Performed at [facility], [address], [place], [place] [address]  . Prothrombin Time 05/26/2019 12.7  11.4 - 15.2 seconds Final  . INR 05/26/2019 1.0  0.8 - 1.2 Final   Comment: (NOTE) INR goal varies based on device and disease states. Performed at [facility], [address], [place], [place] [address]   . ABO/RH(D) 05/26/2019 A POS   Final  . Antibody Screen 05/26/2019 NEG   Final  . Sample Expiration 05/26/2019 06/03/2019,2359   Final  . Extend sample reason 05/26/2019    Final                   Value:NO TRANSFUSIONS OR PREGNANCY IN THE PAST 3 MONTHS Performed at [facility], [address], [place], [place] [address]   . MRSA, PCR 05/26/2019 NEGATIVE  NEGATIVE Final  . Staphylococcus aureus 05/26/2019 NEGATIVE  NEGATIVE Final   Comment: (NOTE) The Xpert SA Assay (FDA approved for NASAL specimens in patients 3 years of age and older), is one component of a comprehensive surveillance program. It is not intended to diagnose infection nor to guide or monitor treatment. Performed at [facility], [address], [place], [place] [address]   . Hgb A1c MFr Bld 05/26/2019 6.0* 4.8 - 5.6 % Final   Comment: (NOTE) Pre diabetes:          5.7%-6.4% Diabetes:              >6.4% Glycemic control for   <7.0% adults with  diabetes   . Mean Plasma Glucose 05/26/2019 125.5  mg/dL Final   Performed at [facility], [address] [place], [place] [address]     X-Rays:Dg Pelvis Portable  Result Date: 05/31/2019 CLINICAL DATA:  Status post hip replacement. EXAM: PORTABLE PELVIS 1-2 VIEWS COMPARISON:  Radiograph of October 03, 2018. FINDINGS: Interval placement of right total hip arthroplasty. The right acetabular and femoral components appear to be well situated. Expected postoperative changes including surgical drain are seen in the surrounding soft tissues. IMPRESSION: Status post right total hip arthroplasty. Electronically Signed   By: Lupita Raider M.D.   On: 05/31/2019 13:38   Dg C-arm 1-60 Min-no Report  Result Date: 05/31/2019 Fluoroscopy was utilized by the requesting physician.  No radiographic interpretation.   Dg Hip Operative Unilat W Or W/o Pelvis Right  Result Date: 05/31/2019 CLINICAL DATA:  Status post right hip arthroplasty. EXAM: OPERATIVE right HIP (WITH PELVIS IF PERFORMED) 1 VIEWS TECHNIQUE: Fluoroscopic spot image(s) were submitted for interpretation post-operatively. COMPARISON:  03/15/2019 FINDINGS: Two views scratch set 2 images from portable C-arm radiography show postoperative change from a right total hip arthroplasty. The hardware components are in anatomic alignment. No complications. IMPRESSION: 1. Status post right hip arthroplasty. Electronically Signed   By: Signa Kell M.D.   On: 05/31/2019 15:10    EKG: Orders placed or performed during the hospital encounter of 05/26/19  . EKG 12 lead  . EKG 12 lead     Hospital Course: Patient was admitted to [facility] and taken to the OR and underwent the above state procedure without complications.  Patient tolerated the procedure well and was later transferred to the recovery room and then to the orthopaedic floor for postoperative care.  They were given  PO and IV analgesics for pain control following their surgery.   They were given 24 hours of postoperative antibiotics of  Anti-infectives (From admission, onward)   Start     Dose/Rate Route Frequency Ordered Stop   05/31/19 1700  ceFAZolin (ANCEF) IVPB 1 g/50 mL premix     1 g 100 mL/hr over 30 Minutes Intravenous Every 6 hours 05/31/19 1401 06/01/19 0026   05/31/19 0900  ceFAZolin (ANCEF) IVPB 2g/100 mL premix     2 g 200 mL/hr over 30 Minutes Intravenous On call to O.R. 05/31/19 1610 05/31/19 1039     and started on DVT prophylaxis in the form of Aspirin.   PT and OT were ordered for total hip protocol.  The patient was allowed to be WBAT with therapy. Discharge planning was consulted to help with postop disposition and equipment needs.  Patient had a good night on the evening of surgery.  They started to get up OOB with therapy on day one.  Hemovac drain was pulled without difficulty.  The patient had progressed with therapy and meeting their goals.  Incision was healing well.  Patient was seen in rounds and was ready to go home.  Diet: Cardiac diet Activity:WBAT Follow-up:in 2 weeks Disposition - Home Discharged Condition: stable   Discharge Instructions    Call MD / Call 911   Complete by: As directed    If you experience chest pain or shortness of breath, CALL 911 and be transported to the hospital emergency room.  If you develope a fever above 101 F, pus (white drainage) or increased drainage or redness at the wound, or calf pain, call your surgeon's office.   Constipation Prevention   Complete by: As directed    Drink plenty of fluids.  Prune juice may be helpful.  You may use a stool softener, such as Colace (over the counter) 100 mg twice a day.  Use MiraLax (over the counter) for constipation as needed.   Diet - low sodium heart healthy   Complete by: As directed    Discharge instructions   Complete by: As directed    Dr. Ollen Gross Total Joint Specialist [facility] [address], [address] [place], [place] [address] [phone]  ANTERIOR APPROACH TOTAL HIP REPLACEMENT POSTOPERATIVE DIRECTIONS   Hip Rehabilitation, Guidelines Following Surgery  The results of a hip operation are greatly improved after range of motion and muscle strengthening exercises. Follow all safety measures which are given to protect your hip. If any of these exercises cause increased pain or swelling in your joint, decrease the amount until you are comfortable again. Then slowly increase the exercises. Call your caregiver if you have problems or questions.   HOME CARE INSTRUCTIONS  Remove items at home which could result in a fall. This includes throw rugs or furniture in walking pathways.  ICE to the affected hip every three hours for 30 minutes at a time and then as needed for pain and swelling.  Continue to use ice on the hip for pain and swelling from surgery. You may notice swelling that will progress down to the foot and ankle.  This is normal after surgery.  Elevate the leg when you are not up walking on it.   Continue to use the breathing machine which will help keep your temperature down.  It is common for your temperature to cycle up and down following surgery, especially at night when you are not up moving around and exerting yourself.  The  breathing machine keeps your lungs expanded and your temperature down.   DIET You may resume your previous home diet once your are discharged from the hospital.  DRESSING / WOUND CARE / SHOWERING You may shower 3 days after surgery, but keep the wounds dry during showering.  You may use an occlusive plastic wrap (Press'n Seal for example), NO SOAKING/SUBMERGING IN THE BATHTUB.  If the bandage gets wet, change with a clean dry gauze.  If the incision gets wet, pat the wound dry with a clean towel. You may start showering once you are discharged home but do not submerge the incision under water. Just pat the incision dry and apply a dry gauze dressing on daily. Change the surgical dressing  daily and reapply a dry dressing each time.  ACTIVITY Walk with your walker as instructed. Use walker as long as suggested by your caregivers. Avoid periods of inactivity such as sitting longer than an hour when not asleep. This helps prevent blood clots.  You may resume a sexual relationship in one month or when given the OK by your doctor.  You may return to work once you are cleared by your doctor.  Do not drive a car for 6 weeks or until released by you surgeon.  Do not drive while taking narcotics.  WEIGHT BEARING Weight bearing as tolerated with assist device (walker, cane, etc) as directed, use it as long as suggested by your surgeon or therapist, typically at least 4-6 weeks.  POSTOPERATIVE CONSTIPATION PROTOCOL Constipation - defined medically as fewer than three stools per week and severe constipation as less than one stool per week.  One of the most common issues patients have following surgery is constipation.  Even if you have a regular bowel pattern at home, your normal regimen is likely to be disrupted due to multiple reasons following surgery.  Combination of anesthesia, postoperative narcotics, change in appetite and fluid intake all can affect your bowels.  In order to avoid complications following surgery, here are some recommendations in order to help you during your recovery period.  Colace (docusate) - Pick up an over-the-counter form of Colace or another stool softener and take twice a day as long as you are requiring postoperative pain medications.  Take with a full glass of water daily.  If you experience loose stools or diarrhea, hold the colace until you stool forms back up.  If your symptoms do not get better within 1 week or if they get worse, check with your doctor.  Dulcolax (bisacodyl) - Pick up over-the-counter and take as directed by the product packaging as needed to assist with the movement of your bowels.  Take with a full glass of water.  Use this product as  needed if not relieved by Colace only.   MiraLax (polyethylene glycol) - Pick up over-the-counter to have on hand.  MiraLax is a solution that will increase the amount of water in your bowels to assist with bowel movements.  Take as directed and can mix with a glass of water, juice, soda, coffee, or tea.  Take if you go more than two days without a movement. Do not use MiraLax more than once per day. Call your doctor if you are still constipated or irregular after using this medication for 7 days in a row.  If you continue to have problems with postoperative constipation, please contact the office for further assistance and recommendations.  If you experience "the worst abdominal pain ever" or develop nausea or vomiting,  please contact the office immediatly for further recommendations for treatment.  ITCHING  If you experience itching with your medications, try taking only a single pain pill, or even half a pain pill at a time.  You can also use Benadryl over the counter for itching or also to help with sleep.   TED HOSE STOCKINGS Wear the elastic stockings on both legs for three weeks following surgery during the day but you may remove then at night for sleeping.  MEDICATIONS See your medication summary on the "After Visit Summary" that the nursing staff will review with you prior to discharge.  You may have some home medications which will be placed on hold until you complete the course of blood thinner medication.  It is important for you to complete the blood thinner medication as prescribed by your surgeon.  Continue your approved medications as instructed at time of discharge.  PRECAUTIONS If you experience chest pain or shortness of breath - call 911 immediately for transfer to the hospital emergency department.  If you develop a fever greater that 101 F, purulent drainage from wound, increased redness or drainage from wound, foul odor from the wound/dressing, or calf pain - CONTACT YOUR  SURGEON.                                                   FOLLOW-UP APPOINTMENTS Make sure you keep all of your appointments after your operation with your surgeon and caregivers. You should call the office at the above phone number and make an appointment for approximately two weeks after the date of your surgery or on the date instructed by your surgeon outlined in the "After Visit Summary".  RANGE OF MOTION AND STRENGTHENING EXERCISES  These exercises are designed to help you keep full movement of your hip joint. Follow your caregiver's or physical therapist's instructions. Perform all exercises about fifteen times, three times per day or as directed. Exercise both hips, even if you have had only one joint replacement. These exercises can be done on a training (exercise) mat, on the floor, on a table or on a bed. Use whatever works the best and is most comfortable for you. Use music or television while you are exercising so that the exercises are a pleasant break in your day. This will make your life better with the exercises acting as a break in routine you can look forward to.  Lying on your back, slowly slide your foot toward your buttocks, raising your knee up off the floor. Then slowly slide your foot back down until your leg is straight again.  Lying on your back spread your legs as far apart as you can without causing discomfort.  Lying on your side, raise your upper leg and foot straight up from the floor as far as is comfortable. Slowly lower the leg and repeat.  Lying on your back, tighten up the muscle in the front of your thigh (quadriceps muscles). You can do this by keeping your leg straight and trying to raise your heel off the floor. This helps strengthen the largest muscle supporting your knee.  Lying on your back, tighten up the muscles of your buttocks both with the legs straight and with the knee bent at a comfortable angle while keeping your heel on the floor.   IF YOU ARE  TRANSFERRED TO  A SKILLED REHAB FACILITY If the patient is transferred to a skilled rehab facility following release from the hospital, a list of the current medications will be sent to the facility for the patient to continue.  When discharged from the skilled rehab facility, please have the facility set up the patient's Home Health Physical Therapy prior to being released. Also, the skilled facility will be responsible for providing the patient with their medications at time of release from the facility to include their pain medication, the muscle relaxants, and their blood thinner medication. If the patient is still at the rehab facility at time of the two week follow up appointment, the skilled rehab facility will also need to assist the patient in arranging follow up appointment in our office and any transportation needs.  MAKE SURE YOU:  Understand these instructions.  Get help right away if you are not doing well or get worse.    Pick up stool softner and laxative for home use following surgery while on pain medications. Do not submerge incision under water. Please use good hand washing techniques while changing dressing each day. May shower starting three days after surgery. Please use a clean towel to pat the incision dry following showers. Continue to use ice for pain and swelling after surgery. Do not use any lotions or creams on the incision until instructed by your surgeon.   Increase activity slowly as tolerated   Complete by: As directed      Allergies as of 06/01/2019      Reactions   Sulfamethoxazole Anaphylaxis   Morphine And Related Nausea And Vomiting   Codeine Rash      Medication List    STOP taking these medications   HYDROcodone-acetaminophen 5-325 MG tablet Commonly known as: NORCO/VICODIN     TAKE these medications   albuterol (5 MG/ML) 0.5% nebulizer solution Commonly known as: PROVENTIL Take 2.5 mg by nebulization every 6 (six) hours as needed for  wheezing or shortness of breath.   albuterol 108 (90 Base) MCG/ACT inhaler Commonly known as: VENTOLIN HFA Inhale 1-2 puffs into the lungs every 6 (six) hours as needed for wheezing or shortness of breath.   aspirin 325 MG EC tablet Take 1 tablet (325 mg total) by mouth 2 (two) times daily. What changed:   medication strength  how much to take  when to take this   cycloSPORINE 0.05 % ophthalmic emulsion Commonly known as: RESTASIS Place 1 drop into both eyes 2 (two) times daily.   estradiol 0.5 MG tablet Commonly known as: ESTRACE Take 0.5 mg by mouth daily.   gabapentin 800 MG tablet Commonly known as: NEURONTIN Take 800 mg by mouth 3 (three) times daily.   GLUCOSAMINE CHONDR 1500 COMPLX PO Take 1 tablet by mouth daily.   methocarbamol 500 MG tablet Commonly known as: ROBAXIN Take 1 tablet (500 mg total) by mouth every 6 (six) hours as needed for muscle spasms.   metoprolol tartrate 25 MG tablet Commonly known as: LOPRESSOR Take 25 mg by mouth 2 (two) times daily.   multivitamin with minerals tablet Take 1 tablet by mouth daily.   omeprazole 40 MG capsule Commonly known as: PRILOSEC Take 40 mg by mouth daily.   oxyCODONE 5 MG immediate release tablet Commonly known as: Oxy IR/ROXICODONE Take 1 tablet (5 mg total) by mouth every 4 (four) hours as needed for severe pain.   rosuvastatin 40 MG tablet Commonly known as: CRESTOR Take 40 mg by mouth daily.   traMADol  50 MG tablet Commonly known as: ULTRAM Take 1-2 tablets (50-100 mg total) by mouth every 6 (six) hours as needed for moderate pain.      Follow-up Information    Ollen Gross, MD. Schedule an appointment as soon as possible for a visit on 06/13/2019.   Specialty: Orthopedic Surgery Contact information: [address] [place] [address] [phone]           Signed: Dimitri Ped, PA-C Orthopaedic Surgery 06/01/2019, 3:27 PM

## 2019-06-01 NOTE — Progress Notes (Signed)
Patient discharged to home w/ family. Given all belongings, instructions. Verbalized understanding of instructions. Escorted to pov via w/c. 

## 2019-06-01 NOTE — Progress Notes (Signed)
Physical Therapy Treatment Patient Details Name: Karen Woods MRN: [id number] DOB: Jan 21, 1946 Today's Date: 06/01/2019    History of Present Illness pt admit with s/p R DATHA 05/31/2019, with hx of L DATHA 09/2018, and bx surgery 2016.    PT Comments    POD #1 Pt eager to go home.  Assisted with amb a greater distance in hallway, practiced stairs, Then returned to room to perform some TE's following HEP handout.  Instructed on proper tech, freq as well as use of ICE.   Addressed all mobility questions, discussed appropriate activity, educated on use of ICE.  Pt ready for D/C to home.   Follow Up Recommendations  Follow surgeon's recommendation for DC plan and follow-up therapies     Equipment Recommendations  None recommended by PT    Recommendations for Other Services       Precautions / Restrictions Precautions Precautions: None Restrictions Weight Bearing Restrictions: No Other Position/Activity Restrictions: WBAT    Mobility  Bed Mobility               General bed mobility comments: OOB in recliner  Transfers Overall transfer level: Needs assistance Equipment used: Rolling walker (2 wheeled) Transfers: Sit to/from Stand Sit to Stand: Supervision         General transfer comment: one VC to decrease impulsive "get up and go" safety concerns  Ambulation/Gait Ambulation/Gait assistance: Supervision Gait Distance (Feet): 75 Feet Assistive device: Rolling walker (2 wheeled) Gait Pattern/deviations: Step-to pattern;Step-through pattern Gait velocity: decreased   General Gait Details: feeling better.  One VC safety with turns   Stairs Stairs: Yes Stairs assistance: Min assist Stair Management: Step to pattern;Forwards;No rails;With walker Number of Stairs: 3 General stair comments: 25% VC's on proper walker placement, assist safety and sequencing   Wheelchair Mobility    Modified Rankin (Stroke Patients Only)       Balance                                            Cognition Arousal/Alertness: Awake/alert Behavior During Therapy: WFL for tasks assessed/performed Overall Cognitive Status: Within Functional Limits for tasks assessed                                        Exercises   Total Hip Replacement TE's 10 reps ankle pumps 10 reps knee presses 10 reps heel slides 10 reps SAQ's 10 reps ABD Followed by ICE     General Comments        Pertinent Vitals/Pain Pain Assessment: 0-10 Pain Score: 8  Pain Location: R hip Pain Descriptors / Indicators: Grimacing;Tender;Tightness Pain Intervention(s): Premedicated before session;Monitored during session;Repositioned;Ice applied    Home Living                      Prior Function            PT Goals (current goals can now be found in the care plan section) Progress towards PT goals: Progressing toward goals    Frequency    7X/week      PT Plan Discharge plan needs to be updated    Co-evaluation              AM-PAC PT "6 Clicks" Mobility   Outcome Measure  Help needed  turning from your back to your side while in a flat bed without using bedrails?: A Little Help needed moving from lying on your back to sitting on the side of a flat bed without using bedrails?: A Little Help needed moving to and from a bed to a chair (including a wheelchair)?: A Little Help needed standing up from a chair using your arms (e.g., wheelchair or bedside chair)?: A Little Help needed to walk in hospital room?: A Little Help needed climbing 3-5 steps with a railing? : A Little 6 Click Score: 18    End of Session Equipment Utilized During Treatment: Gait belt Activity Tolerance: Patient tolerated treatment well Patient left: in chair;with call bell/phone within reach;with chair alarm set Nurse Communication: Mobility status PT Visit Diagnosis: Other abnormalities of gait and mobility (R26.89);Difficulty in walking, not elsewhere  classified (R26.2)     Time: [id number] PT Time Calculation (min) (ACUTE ONLY): 26 min  Charges:  $Gait Training: 8-22 mins $Therapeutic Exercise: 8-22 mins                     Felecia ShellingLori Kamille Toomey  PTA Acute  Rehabilitation Services Pager      [phone] Office      [phone]

## 2019-06-01 NOTE — Progress Notes (Signed)
   Subjective: 1 Day Post-Op Procedure(s) (LRB): TOTAL HIP ARTHROPLASTY ANTERIOR APPROACH (Right) Patient reports pain as mild.   Patient seen in rounds with Dr. Wynelle Link. Patient is well, and has had no acute complaints or problems other than discomfort in her right thigh. Denies SOB and chest pain. Voiding well. Positive flatus. Reports that she accidentally pulled her hemovac out yesterday. Plan is to go Home after hospital stay.  Objective: Vital signs in last 24 hours: Temp:  [97.5 F (36.4 C)-98.4 F (36.9 C)] 98.3 F (36.8 C) (09/10 0533) Pulse Rate:  [48-86] 54 (09/10 0533) Resp:  [11-18] 16 (09/10 0533) BP: (102-167)/(55-90) 102/62 (09/10 0533) SpO2:  [97 %-100 %] 97 % (09/10 0533) Weight:  [68 kg] 68 kg (09/09 1404)  Intake/Output from previous day:  Intake/Output Summary (Last 24 hours) at 06/01/2019 0804 Last data filed at 06/01/2019 0716 Gross per 24 hour  Intake 2455.41 ml  Output 1700 ml  Net 755.41 ml    Intake/Output this shift: Total I/O In: -  Out: 400 [Urine:400]  Labs: Recent Labs    06/01/19 0300  HGB 9.9*   Recent Labs    06/01/19 0300  WBC 10.9*  RBC 3.44*  HCT 30.9*  PLT 180   Recent Labs    06/01/19 0300  NA 138  K 4.4  CL 104  CO2 25  BUN 13  CREATININE 0.72  GLUCOSE 128*  CALCIUM 8.3*    EXAM General - Patient is Alert and Oriented Extremity - Neurologically intact Intact pulses distally Dorsiflexion/Plantar flexion intact No cellulitis present Compartment soft Dressing - dressing C/D/I Motor Function - intact, moving foot and toes well on exam.    Past Medical History:  Diagnosis Date  . Arthritis   . Back pain   . COPD (chronic obstructive pulmonary disease) (HCC)    Mild  . Coronary artery disease   . Depression   . GERD (gastroesophageal reflux disease)   . Heart murmur   . History of aortic stenosis    Severe  . History of prolapse of bladder   . Hyperlipidemia   . Hypertension   . Osteoporosis   .  Pneumonia   . PONV (postoperative nausea and vomiting)   . Pre-diabetes   . Small vessel disease, cerebrovascular   . Vertebrobasilar artery insufficiency   . Vertigo     Assessment/Plan: 1 Day Post-Op Procedure(s) (LRB): TOTAL HIP ARTHROPLASTY ANTERIOR APPROACH (Right) Active Problems:   OA (osteoarthritis) of hip  Estimated body mass index is 26.57 kg/m as calculated from the following:   Height as of this encounter: 5\' 3"  (1.6 m).   Weight as of this encounter: 68 kg. Advance diet Up with therapy D/C IV fluids when tolerating POs well  DVT Prophylaxis - Aspirin Weight Bearing As Tolerated   Plan for her to continue PT. Hopeful for DC home today. Discharge instructions given. Follow up in 2 weeks.  Ardeen Jourdain, PA-C Orthopaedic Surgery 06/01/2019, 8:04 AM

## 2019-06-02 NOTE — Anesthesia Postprocedure Evaluation (Signed)
Anesthesia Post Note  Patient: Karen Woods  Procedure(s) Performed: TOTAL HIP ARTHROPLASTY ANTERIOR APPROACH (Right Hip)     Patient location during evaluation: PACU Anesthesia Type: Spinal Level of consciousness: sedated Pain management: pain level controlled Vital Signs Assessment: post-procedure vital signs reviewed and stable Respiratory status: spontaneous breathing Cardiovascular status: stable Postop Assessment: no headache, no backache, spinal receding and no apparent nausea or vomiting    Last Vitals:  Vitals:   06/01/19 1019 06/01/19 1021  BP:  116/65  Pulse: 60 60  Resp:  16  Temp:  36.8 C  SpO2:  92%    Last Pain:  Vitals:   06/01/19 1221  TempSrc:   PainSc: 4    Pain Goal: Patients Stated Pain Goal: 3 (06/01/19 1221)                 Huston Foley

## 2020-05-07 NOTE — Progress Notes (Addendum)
COVID Vaccine Completed: x1 Date COVID Vaccine completed:02-21-2020 COVID vaccine manufacturer: [employer]   PCP - Susa Loffler, MD Cardiologist - Elissa Hefty Last OV 02-12-20 (Care Everywhere)  Clearance on chart  Chest x-ray -  EKG - 05-26-2019 in Epic Stress Test -  ECHO - 04-05-20 In Care Everywhere Cardiac Cath - not recently  Sleep Study -  CPAP -   Fasting Blood Sugar - 80s Checks Blood Sugar periodically  Blood Thinner Instructions: Aspirin Instructions:  ASA 81 mg Last Dose:  05-06-20  Anesthesia review: COPD, CAD, aortic stenosis.  S/P AVR 2015  Patient has shortness of breath with exertion due to COPD.  No fever, cough and chest pain at PAT appointment   Patient verbalized understanding of instructions that were given to them at the PAT appointment. Patient was also instructed that they will need to review over the PAT instructions again at home before surgery.

## 2020-05-07 NOTE — Patient Instructions (Addendum)
DUE TO COVID-19 ONLY ONE VISITOR IS ALLOWED TO COME WITH YOU AND STAY IN THE WAITING ROOM ONLY  DURING PRE OP AND PROCEDURE.   IF YOU WILL BE ADMITTED INTO THE HOSPITAL YOU ARE ALLOWED ONE SUPPORT PERSON DURING VISITATION  HOURS ONLY (10AM -8PM)   . The support person may change daily. . The support person must pass our screening, gel in and out, and wear a mask at all times, including in the patient's room. . Patients must also wear a mask when staff or their support person are in the room.   COVID SWAB TESTING MUST BE COMPLETED ON:  Friday, 05-10-20 @ 1:10 PM    [address] [place], [place] [address]  (Must self quarantine after testing. Follow instructions on handout.)   Your procedure is scheduled on:  Tuesday, 05-14-20   Report to [facility] Main  Entrance   Report to Short Stay at 5:30 AM   [facility])    Call this number if you have problems the morning of surgery [phone]   Do not eat food :After Midnight.   May have liquids until 4:30 AM   day of surgery  CLEAR LIQUID DIET  Foods Allowed                                                                     Foods Excluded  Water, Black Coffee and tea, regular and decaf             liquids that you cannot  Plain Jell-O in any flavor  (No red)                                    see through such as: Fruit ices (not with fruit pulp)                                      milk, soups, orange juice              Iced Popsicles (No red)                                      All solid food                                   Apple juices Sports drinks like Gatorade (No red) Lightly seasoned clear broth or consume(fat free) Sugar, honey syrup    Complete one G2 drink the morning of surgery at  4:30 AM  the day of surgery.    Oral Hygiene is also important to reduce your risk of infection.                                    Remember - BRUSH YOUR TEETH THE MORNING OF SURGERY WITH YOUR REGULAR TOOTHPASTE   Do NOT  smoke after Midnight  Take these medicines the morning of surgery with A SIP OF WATER: Estradiol, Gabapentin, Hydrocodone, Metoprolol,  Omeprazole, Rosuvastatin                                You may not have any metal on your body including hair pins, jewelry, and body piercings             Do not wear make-up, lotions, powders, perfumes/cologne, or deodorant             Do not wear nail polish.  Do not shave  48 hours prior to surgery.           Do not bring valuables to the hospital. [facility] IS NOT RESPONSIBLE   FOR VALUABLES.   Contacts, dentures or bridgework may not be worn into surgery.   Bring small overnight bag day of surgery.                  Please read over the following fact sheets you were given: IF YOU HAVE QUESTIONS ABOUT YOUR PRE OP INSTRUCTIONS  PLEASE CALL [phone]   [place]- Preparing for Total Shoulder Arthroplasty    Before surgery, you can play an important role. Because skin is not sterile, your skin needs to be as free of germs as possible. You can reduce the number of germs on your skin by using the following products. . Benzoyl Peroxide Gel o Reduces the number of germs present on the skin o Applied twice a day to shoulder area starting two days before surgery    ==================================================================  Please follow these instructions carefully:  BENZOYL PEROXIDE 5% GEL  Please do not use if you have an allergy to benzoyl peroxide.   If your skin becomes reddened/irritated stop using the benzoyl peroxide.  Starting two days before surgery, apply as follows: 1. Apply benzoyl peroxide in the morning and at night. Apply after taking a shower. If you are not taking a shower clean entire shoulder front, back, and side along with the armpit with a clean wet washcloth.  2. Place a quarter-sized dollop on your shoulder and rub in thoroughly, making sure to cover the front, back, and side of your shoulder, along with  the armpit.   2 days before ____ AM   ____ PM              1 day before ____ AM   ____ PM                         3. Do this twice a day for two days.  (Last application is the night before surgery, AFTER using the CHG soap as described below).  4. Do NOT apply benzoyl peroxide gel on the day of surgery.   [place] - Preparing for Surgery Before surgery, you can play an important role.  Because skin is not sterile, your skin needs to be as free of germs as possible.  You can reduce the number of germs on your skin by washing with CHG (chlorahexidine gluconate) soap before surgery.  CHG is an antiseptic cleaner which kills germs and bonds with the skin to continue killing germs even after washing. Please DO NOT use if you have an allergy to CHG or antibacterial soaps.  If your skin becomes reddened/irritated stop using the CHG and inform your nurse when you arrive at Short Stay. Do  not shave (including legs and underarms) for at least 48 hours prior to the first CHG shower.  You may shave your face/neck.  Please follow these instructions carefully:  1.  Shower with CHG Soap the night before surgery and the  morning of surgery.  2.  If you choose to wash your hair, wash your hair first as usual with your normal  shampoo.  3.  After you shampoo, rinse your hair and body thoroughly to remove the shampoo.                             4.  Use CHG as you would any other liquid soap.  You can apply chg directly to the skin and wash.  Gently with a scrungie or clean washcloth.  5.  Apply the CHG Soap to your body ONLY FROM THE NECK DOWN.   Do   not use on face/ open                           Wound or open sores. Avoid contact with eyes, ears mouth and   genitals (private parts).                       Wash face,  Genitals (private parts) with your normal soap.             6.  Wash thoroughly, paying special attention to the area where your    surgery  will be performed.  7.  Thoroughly rinse your body  with warm water from the neck down.  8.  DO NOT shower/wash with your normal soap after using and rinsing off the CHG Soap.                9.  Pat yourself dry with a clean towel.            10.  Wear clean pajamas.            11.  Place clean sheets on your bed the night of your first shower and do not  sleep with pets. Day of Surgery : Do not apply any lotions/deodorants the morning of surgery.  Please wear clean clothes to the hospital/surgery center.  FAILURE TO FOLLOW THESE INSTRUCTIONS MAY RESULT IN THE CANCELLATION OF YOUR SURGERY  PATIENT SIGNATURE_________________________________  NURSE SIGNATURE__________________________________  ________________________________________________________________________   Rogelia Mire  An incentive spirometer is a tool that can help keep your lungs clear and active. This tool measures how well you are filling your lungs with each breath. Taking long deep breaths may help reverse or decrease the chance of developing breathing (pulmonary) problems (especially infection) following:  A long period of time when you are unable to move or be active. BEFORE THE PROCEDURE   If the spirometer includes an indicator to show your best effort, your nurse or respiratory therapist will set it to a desired goal.  If possible, sit up straight or lean slightly forward. Try not to slouch.  Hold the incentive spirometer in an upright position. INSTRUCTIONS FOR USE  1. Sit on the edge of your bed if possible, or sit up as far as you can in bed or on a chair. 2. Hold the incentive spirometer in an upright position. 3. Breathe out normally. 4. Place the mouthpiece in your mouth and seal your lips tightly around it. 5. Breathe in slowly and as  deeply as possible, raising the piston or the ball toward the top of the column. 6. Hold your breath for 3-5 seconds or for as long as possible. Allow the piston or ball to fall to the bottom of the column. 7. Remove  the mouthpiece from your mouth and breathe out normally. 8. Rest for a few seconds and repeat Steps 1 through 7 at least 10 times every 1-2 hours when you are awake. Take your time and take a few normal breaths between deep breaths. 9. The spirometer may include an indicator to show your best effort. Use the indicator as a goal to work toward during each repetition. 10. After each set of 10 deep breaths, practice coughing to be sure your lungs are clear. If you have an incision (the cut made at the time of surgery), support your incision when coughing by placing a pillow or rolled up towels firmly against it. Once you are able to get out of bed, walk around indoors and cough well. You may stop using the incentive spirometer when instructed by your caregiver.  RISKS AND COMPLICATIONS  Take your time so you do not get dizzy or light-headed.  If you are in pain, you may need to take or ask for pain medication before doing incentive spirometry. It is harder to take a deep breath if you are having pain. AFTER USE  Rest and breathe slowly and easily.  It can be helpful to keep track of a log of your progress. Your caregiver can provide you with a simple table to help with this. If you are using the spirometer at home, follow these instructions: SEEK MEDICAL CARE IF:   You are having difficultly using the spirometer.  You have trouble using the spirometer as often as instructed.  Your pain medication is not giving enough relief while using the spirometer.  You develop fever of 100.5 F (38.1 C) or higher. SEEK IMMEDIATE MEDICAL CARE IF:   You cough up bloody sputum that had not been present before.  You develop fever of 102 F (38.9 C) or greater.  You develop worsening pain at or near the incision site. MAKE SURE YOU:   Understand these instructions.  Will watch your condition.  Will get help right away if you are not doing well or get worse. Document Released: 01/18/2007 Document  Revised: 11/30/2011 Document Reviewed: 03/21/2007 [facility] [employer] Patient Information 2014 [employer] [place].   ________________________________________________________________________

## 2020-05-10 ENCOUNTER — Encounter (HOSPITAL_COMMUNITY)
Admission: RE | Admit: 2020-05-10 | Discharge: 2020-05-10 | Disposition: A | Discharge status: Home / Self Care | Payer: Medicare Other | Source: Ambulatory Visit | Attending: Orthopedic Surgery | Admitting: Orthopedic Surgery

## 2020-05-10 ENCOUNTER — Other Ambulatory Visit: Payer: Self-pay

## 2020-05-10 ENCOUNTER — Encounter (HOSPITAL_COMMUNITY): Payer: Self-pay

## 2020-05-10 ENCOUNTER — Other Ambulatory Visit (HOSPITAL_COMMUNITY)
Admission: RE | Admit: 2020-05-10 | Discharge: 2020-05-10 | Disposition: A | Discharge status: Home / Self Care | Payer: Medicare Other | Source: Ambulatory Visit | Attending: Orthopedic Surgery | Admitting: Orthopedic Surgery

## 2020-05-10 DIAGNOSIS — Z20822 Contact with and (suspected) exposure to covid-19: Secondary | ICD-10-CM | POA: Diagnosis not present

## 2020-05-10 DIAGNOSIS — Z01812 Encounter for preprocedural laboratory examination: Secondary | ICD-10-CM | POA: Insufficient documentation

## 2020-05-10 HISTORY — DX: Anemia, unspecified: D64.9

## 2020-05-10 HISTORY — DX: Dyspnea, unspecified: R06.00

## 2020-05-10 HISTORY — DX: Unspecified cataract: H26.9

## 2020-05-10 LAB — BASIC METABOLIC PANEL
Anion gap: 9 (ref 5–15)
BUN: 12 mg/dL (ref 8–23)
CO2: 27 mmol/L (ref 22–32)
Calcium: 9.4 mg/dL (ref 8.9–10.3)
Chloride: 104 mmol/L (ref 98–111)
Creatinine, Ser: 0.63 mg/dL (ref 0.44–1.00)
GFR calc Af Amer: >60 mL/min (ref >60)
GFR calc non Af Amer: >60 mL/min (ref >60)
Glucose, Bld: 89 mg/dL (ref 70–99)
Potassium: 4.3 mmol/L (ref 3.5–5.1)
Sodium: 140 mmol/L (ref 135–145)

## 2020-05-10 LAB — SURGICAL PCR SCREEN
MRSA, PCR: NEGATIVE
Staphylococcus aureus: NEGATIVE

## 2020-05-10 LAB — SARS CORONAVIRUS 2 (TAT 6-24 HRS): SARS Coronavirus 2: NEGATIVE

## 2020-05-10 LAB — CBC
HCT: 38.7 % (ref 36.0–46.0)
Hemoglobin: 12.4 g/dL (ref 12.0–15.0)
MCH: 30.2 pg (ref 26.0–34.0)
MCHC: 32 g/dL (ref 30.0–36.0)
MCV: 94.2 fL (ref 80.0–100.0)
Platelets: 237 10*3/uL (ref 150–400)
RBC: 4.11 MIL/uL (ref 3.87–5.11)
RDW: 14 % (ref 11.5–15.5)
WBC: 5.6 10*3/uL (ref 4.0–10.5)
nRBC: 0 % (ref 0.0–0.2)

## 2020-05-10 LAB — HEMOGLOBIN A1C
Hgb A1c MFr Bld: 5.9 % — ABNORMAL HIGH (ref 4.8–5.6)
Mean Plasma Glucose: 122.63 mg/dL

## 2020-05-13 NOTE — Anesthesia Preprocedure Evaluation (Addendum)
Anesthesia Evaluation  Patient identified by MRN, date of birth, ID band Patient awake    Reviewed: Allergy & Precautions, NPO status , Patient's Chart, lab work & pertinent test results  Airway Mallampati: II  TM Distance: >3 FB Neck ROM: Full    Dental  (+) Teeth Intact, Dental Advisory Given   Pulmonary former smoker,    breath sounds clear to auscultation       Cardiovascular hypertension,  Rhythm:Regular Rate:Normal     Neuro/Psych    GI/Hepatic   Endo/Other    Renal/GU      Musculoskeletal   Abdominal   Peds  Hematology   Anesthesia Other Findings   Reproductive/Obstetrics                            Anesthesia Physical Anesthesia Plan  ASA: III  Anesthesia Plan: General   Post-op Pain Management:  Regional for Post-op pain   Induction: Intravenous  PONV Risk Score and Plan: 3 and Dexamethasone and Ondansetron  Airway Management Planned: Oral ETT  Additional Equipment:   Intra-op Plan:   Post-operative Plan: Extubation in OR  Informed Consent: I have reviewed the patients History and Physical, chart, labs and discussed the procedure including the risks, benefits and alternatives for the proposed anesthesia with the patient or authorized representative who has indicated his/her understanding and acceptance.     Dental advisory given  Plan Discussed with: CRNA and Anesthesiologist  Anesthesia Plan Comments: (See PAT note 05/10/2020, Jodell Cipro, PA-C)       Anesthesia Quick Evaluation

## 2020-05-13 NOTE — Progress Notes (Signed)
Anesthesia Chart Review   Case: [id number] Date/Time: 05/14/20 0715   Procedure: REVERSE SHOULDER ARTHROPLASTY (Right Shoulder) -   Anesthesia type: General   Pre-op diagnosis: right shoulder osteoarthritis   Location: [facility] ROOM 01 / [facility]   Surgeons: Francena Hanly, MD      DISCUSSION:74 y.o. former smoker with h/o PONV, GERD, HLD, COPD, pre-diabetes, nonobstructive CAD on cath 2015, s/p AVR 2015, right shoulder OA scheduled for above procedure 05/14/2020 with Dr. Francena Hanly.   Pt last seen by cardiology 04/05/2020.  Stable at this visit with no cx sx.  Echo 04/05/2020 with normally functioning aortic valve prosthesis, EF 55-60%.    Anticipate pt can proceed with planned procedure barring acute status change.   VS: BP 135/64   Pulse 62   Temp 37.1 C (Oral)   Resp 18   Ht 5\' 4"  (1.626 m)   Wt 71.2 kg   SpO2 98%   BMI 26.96 kg/m   PROVIDERS: ., MD is PCP   Ignacia Palma, MD is Cardiologist  LABS: Labs reviewed: Acceptable for surgery. (all labs ordered are listed, but only abnormal results are displayed)  Labs Reviewed  HEMOGLOBIN A1C - Abnormal; Notable for the following components:      Result Value   Hgb A1c MFr Bld 5.9 (*)    All other components within normal limits  SURGICAL PCR SCREEN  BASIC METABOLIC PANEL  CBC     IMAGES:   EKG: 05/26/2019 Rate 54 bpm Sinus bradycardia  Possible left atrial enlargement  Borderline ECG  No significant change since last tracing   CV: Echo 04/05/2020 SUMMARY  Left ventricular systolic function is normal.  LV ejection fraction =55-60%.  Normally functioning aortic valve prosthesis  Aortic valve mean pressure gradient is 3 mmHg.  There is mild to moderate mitral regurgitation.  There is mild to moderate tricuspid regurgitation.  Estimated right ventricular systolic pressure is 40 mmHg.  There is no comparison study available.   Past Medical History:  Diagnosis Date  . Anemia   . Arthritis   . Back  pain   . Cataracts, bilateral   . COPD (chronic obstructive pulmonary disease) (HCC)    Mild  . Coronary artery disease   . Depression   . Dyspnea    with exertion  . GERD (gastroesophageal reflux disease)   . Heart murmur   . History of aortic stenosis    Severe  . History of prolapse of bladder   . Hyperlipidemia   . Hypertension   . Osteoporosis   . Pneumonia   . PONV (postoperative nausea and vomiting)   . Pre-diabetes   . Small vessel disease, cerebrovascular   . Vertebrobasilar artery insufficiency   . Vertigo     Past Surgical History:  Procedure Laterality Date  . ABDOMINAL HYSTERECTOMY    . Ablation on neck    . AORTIC VALVE REPLACEMENT (AVR)/CORONARY ARTERY BYPASS GRAFTING (CABG)  10/19/2013  . BACK SURGERY  03/18/2015  . BLEPHAROPLASTY    . CARDIAC CATHETERIZATION    . COLONOSCOPY  12/09/2007  . SACROSPINOUS LIGAMENT FIXATION  06/25/2016  . TONSILLECTOMY    . TOTAL HIP ARTHROPLASTY Left 10/03/2018   Procedure: TOTAL HIP ARTHROPLASTY ANTERIOR APPROACH;  Surgeon: 10/05/2018, MD;  Location: [facility];  Service: Orthopedics;  Laterality: Left;  Ollen Gross  . TOTAL HIP ARTHROPLASTY Right 05/31/2019   Procedure: TOTAL HIP ARTHROPLASTY ANTERIOR APPROACH;  Surgeon: 07/31/2019, MD;  Location: [facility];  Service: Orthopedics;  Laterality: Right;   . WISDOM TOOTH EXTRACTION      MEDICATIONS: . acetaminophen (TYLENOL) 650 MG CR tablet  . albuterol (PROVENTIL) (5 MG/ML) 0.5% nebulizer solution  . albuterol (VENTOLIN HFA) 108 (90 Base) MCG/ACT inhaler  . aspirin EC 81 MG tablet  . cycloSPORINE (RESTASIS) 0.05 % ophthalmic emulsion  . estradiol (ESTRACE) 0.5 MG tablet  . gabapentin (NEURONTIN) 800 MG tablet  . HYDROcodone-acetaminophen (NORCO/VICODIN) 5-325 MG tablet  . metoprolol tartrate (LOPRESSOR) 25 MG tablet  . Multiple Vitamins-Minerals (MULTIVITAMIN WITH MINERALS) tablet  . omeprazole (PRILOSEC) 40 MG capsule  . rosuvastatin (CRESTOR) 40 MG tablet   No  current facility-administered medications for this encounter.    Jodell Cipro, PA-C [facility] Testing [phone]

## 2020-05-14 ENCOUNTER — Encounter (HOSPITAL_COMMUNITY)
Admission: RE | Disposition: A | Discharge status: Home / Self Care | Payer: Self-pay | Source: Home / Self Care | Attending: Orthopedic Surgery

## 2020-05-14 ENCOUNTER — Ambulatory Visit (HOSPITAL_COMMUNITY)
Admission: RE | Admit: 2020-05-14 | Discharge: 2020-05-14 | Disposition: A | Discharge status: Home / Self Care | Payer: Medicare Other | Attending: Orthopedic Surgery | Admitting: Orthopedic Surgery

## 2020-05-14 ENCOUNTER — Ambulatory Visit (HOSPITAL_COMMUNITY): Payer: Medicare Other | Admitting: Anesthesiology

## 2020-05-14 ENCOUNTER — Ambulatory Visit (HOSPITAL_COMMUNITY): Payer: Medicare Other | Admitting: Physician Assistant

## 2020-05-14 ENCOUNTER — Encounter (HOSPITAL_COMMUNITY): Payer: Self-pay | Admitting: Orthopedic Surgery

## 2020-05-14 DIAGNOSIS — R0600 Dyspnea, unspecified: Secondary | ICD-10-CM | POA: Insufficient documentation

## 2020-05-14 DIAGNOSIS — Z952 Presence of prosthetic heart valve: Secondary | ICD-10-CM | POA: Insufficient documentation

## 2020-05-14 DIAGNOSIS — E785 Hyperlipidemia, unspecified: Secondary | ICD-10-CM | POA: Insufficient documentation

## 2020-05-14 DIAGNOSIS — Z90710 Acquired absence of both cervix and uterus: Secondary | ICD-10-CM | POA: Diagnosis not present

## 2020-05-14 DIAGNOSIS — R011 Cardiac murmur, unspecified: Secondary | ICD-10-CM | POA: Insufficient documentation

## 2020-05-14 DIAGNOSIS — Z951 Presence of aortocoronary bypass graft: Secondary | ICD-10-CM | POA: Insufficient documentation

## 2020-05-14 DIAGNOSIS — R42 Dizziness and giddiness: Secondary | ICD-10-CM | POA: Insufficient documentation

## 2020-05-14 DIAGNOSIS — K219 Gastro-esophageal reflux disease without esophagitis: Secondary | ICD-10-CM | POA: Diagnosis not present

## 2020-05-14 DIAGNOSIS — Z79899 Other long term (current) drug therapy: Secondary | ICD-10-CM | POA: Insufficient documentation

## 2020-05-14 DIAGNOSIS — Z96611 Presence of right artificial shoulder joint: Secondary | ICD-10-CM

## 2020-05-14 DIAGNOSIS — I2510 Atherosclerotic heart disease of native coronary artery without angina pectoris: Secondary | ICD-10-CM | POA: Insufficient documentation

## 2020-05-14 DIAGNOSIS — M19011 Primary osteoarthritis, right shoulder: Secondary | ICD-10-CM | POA: Insufficient documentation

## 2020-05-14 DIAGNOSIS — J449 Chronic obstructive pulmonary disease, unspecified: Secondary | ICD-10-CM | POA: Insufficient documentation

## 2020-05-14 DIAGNOSIS — F329 Major depressive disorder, single episode, unspecified: Secondary | ICD-10-CM | POA: Insufficient documentation

## 2020-05-14 DIAGNOSIS — R7303 Prediabetes: Secondary | ICD-10-CM | POA: Insufficient documentation

## 2020-05-14 DIAGNOSIS — G450 Vertebro-basilar artery syndrome: Secondary | ICD-10-CM | POA: Insufficient documentation

## 2020-05-14 DIAGNOSIS — Z96643 Presence of artificial hip joint, bilateral: Secondary | ICD-10-CM | POA: Insufficient documentation

## 2020-05-14 DIAGNOSIS — I10 Essential (primary) hypertension: Secondary | ICD-10-CM | POA: Insufficient documentation

## 2020-05-14 DIAGNOSIS — Z7982 Long term (current) use of aspirin: Secondary | ICD-10-CM | POA: Insufficient documentation

## 2020-05-14 DIAGNOSIS — Z87891 Personal history of nicotine dependence: Secondary | ICD-10-CM | POA: Insufficient documentation

## 2020-05-14 HISTORY — PX: REVERSE SHOULDER ARTHROPLASTY: SHX5054

## 2020-05-14 SURGERY — ARTHROPLASTY, SHOULDER, TOTAL, REVERSE
Anesthesia: General | Site: Shoulder | Laterality: Right

## 2020-05-14 MED ORDER — DEXAMETHASONE SODIUM PHOSPHATE 10 MG/ML IJ SOLN
INTRAMUSCULAR | Status: AC
  Filled 2020-05-14: qty 1

## 2020-05-14 MED ORDER — ONDANSETRON HCL 4 MG/2ML IJ SOLN: 4.0000 mg | Freq: Once | INTRAMUSCULAR | Status: DC | PRN

## 2020-05-14 MED ORDER — FENTANYL CITRATE (PF) 250 MCG/5ML IJ SOLN
INTRAMUSCULAR | Status: DC | PRN
Start: 2020-05-14 — End: 2020-05-14
  Administered 2020-05-14 (×2): 50 ug via INTRAVENOUS

## 2020-05-14 MED ORDER — LACTATED RINGERS IV BOLUS: 250.0000 mL | Freq: Once | INTRAVENOUS | Status: DC

## 2020-05-14 MED ORDER — MIDAZOLAM HCL 2 MG/2ML IJ SOLN
INTRAMUSCULAR | Status: DC | PRN
  Administered 2020-05-14: 1 mg via INTRAVENOUS

## 2020-05-14 MED ORDER — LIDOCAINE 2% (20 MG/ML) 5 ML SYRINGE
INTRAMUSCULAR | Status: AC
  Filled 2020-05-14: qty 5

## 2020-05-14 MED ORDER — HYDROCODONE-ACETAMINOPHEN 5-325 MG PO TABS
1.0000 | ORAL_TABLET | Freq: Three times a day (TID) | ORAL | 0 refills | Status: DC | PRN
Start: 2020-05-14 — End: 2021-10-16

## 2020-05-14 MED ORDER — PHENYLEPHRINE HCL-NACL 10-0.9 MG/250ML-% IV SOLN
INTRAVENOUS | Status: AC
  Filled 2020-05-14: qty 250

## 2020-05-14 MED ORDER — LACTATED RINGERS IV BOLUS
500.0000 mL | Freq: Once | INTRAVENOUS | Status: AC
  Administered 2020-05-14: 500 mL via INTRAVENOUS

## 2020-05-14 MED ORDER — ONDANSETRON HCL 4 MG/2ML IJ SOLN
INTRAMUSCULAR | Status: AC
  Filled 2020-05-14: qty 2

## 2020-05-14 MED ORDER — PROPOFOL 10 MG/ML IV BOLUS
INTRAVENOUS | Status: AC
  Filled 2020-05-14: qty 20

## 2020-05-14 MED ORDER — 0.9 % SODIUM CHLORIDE (POUR BTL) OPTIME
TOPICAL | Status: DC | PRN
  Administered 2020-05-14: 1000 mL

## 2020-05-14 MED ORDER — STERILE WATER FOR IRRIGATION IR SOLN
Status: DC | PRN
  Administered 2020-05-14: 2000 mL

## 2020-05-14 MED ORDER — METOCLOPRAMIDE HCL 5 MG/ML IJ SOLN: 5.0000 mg | Freq: Three times a day (TID) | INTRAMUSCULAR | Status: DC | PRN

## 2020-05-14 MED ORDER — AMISULPRIDE (ANTIEMETIC) 5 MG/2ML IV SOLN
INTRAVENOUS | Status: AC
  Filled 2020-05-14: qty 2

## 2020-05-14 MED ORDER — PHENYLEPHRINE 40 MCG/ML (10ML) SYRINGE FOR IV PUSH (FOR BLOOD PRESSURE SUPPORT)
PREFILLED_SYRINGE | INTRAVENOUS | Status: DC | PRN
  Administered 2020-05-14: 80 ug via INTRAVENOUS
  Administered 2020-05-14: 120 ug via INTRAVENOUS

## 2020-05-14 MED ORDER — ONDANSETRON HCL 4 MG PO TABS: 4.0000 mg | ORAL_TABLET | Freq: Three times a day (TID) | ORAL | 0 refills | Status: DC | PRN

## 2020-05-14 MED ORDER — FENTANYL CITRATE (PF) 100 MCG/2ML IJ SOLN: 25.0000 ug | INTRAMUSCULAR | Status: DC | PRN

## 2020-05-14 MED ORDER — CHLORHEXIDINE GLUCONATE 0.12 % MT SOLN
15.0000 mL | Freq: Once | OROMUCOSAL | Status: AC
  Administered 2020-05-14: 15 mL via OROMUCOSAL

## 2020-05-14 MED ORDER — METOCLOPRAMIDE HCL 5 MG PO TABS
5.0000 mg | ORAL_TABLET | Freq: Three times a day (TID) | ORAL | Status: DC | PRN
  Filled 2020-05-14: qty 2

## 2020-05-14 MED ORDER — ROCURONIUM BROMIDE 10 MG/ML (PF) SYRINGE
PREFILLED_SYRINGE | INTRAVENOUS | Status: DC | PRN
  Administered 2020-05-14: 50 mg via INTRAVENOUS

## 2020-05-14 MED ORDER — LIDOCAINE HCL (CARDIAC) PF 100 MG/5ML IV SOSY
PREFILLED_SYRINGE | INTRAVENOUS | Status: DC | PRN
  Administered 2020-05-14: 40 mg via INTRAVENOUS

## 2020-05-14 MED ORDER — ONDANSETRON HCL 4 MG PO TABS
4.0000 mg | ORAL_TABLET | Freq: Four times a day (QID) | ORAL | Status: DC | PRN
  Filled 2020-05-14: qty 1

## 2020-05-14 MED ORDER — MIDAZOLAM HCL 2 MG/2ML IJ SOLN
INTRAMUSCULAR | Status: AC
  Filled 2020-05-14: qty 2

## 2020-05-14 MED ORDER — FENTANYL CITRATE (PF) 250 MCG/5ML IJ SOLN
INTRAMUSCULAR | Status: AC
  Filled 2020-05-14: qty 5

## 2020-05-14 MED ORDER — SUGAMMADEX SODIUM 200 MG/2ML IV SOLN
INTRAVENOUS | Status: DC | PRN
  Administered 2020-05-14: 150 mg via INTRAVENOUS

## 2020-05-14 MED ORDER — ONDANSETRON HCL 4 MG/2ML IJ SOLN
4.0000 mg | Freq: Once | INTRAMUSCULAR | Status: AC | PRN
  Administered 2020-05-14: 4 mg via INTRAVENOUS

## 2020-05-14 MED ORDER — PROPOFOL 10 MG/ML IV BOLUS
INTRAVENOUS | Status: DC | PRN
  Administered 2020-05-14: 40 mg via INTRAVENOUS
  Administered 2020-05-14: 120 mg via INTRAVENOUS

## 2020-05-14 MED ORDER — TRANEXAMIC ACID-NACL 1000-0.7 MG/100ML-% IV SOLN
1000.0000 mg | INTRAVENOUS | Status: AC
  Administered 2020-05-14: 1000 mg via INTRAVENOUS

## 2020-05-14 MED ORDER — NAPROXEN 500 MG PO TABS: 500.0000 mg | ORAL_TABLET | Freq: Two times a day (BID) | ORAL | 1 refills | Status: DC

## 2020-05-14 MED ORDER — PHENYLEPHRINE HCL-NACL 10-0.9 MG/250ML-% IV SOLN
INTRAVENOUS | Status: DC | PRN
  Administered 2020-05-14: 40 ug/min via INTRAVENOUS

## 2020-05-14 MED ORDER — ONDANSETRON HCL 4 MG/2ML IJ SOLN: 4.0000 mg | Freq: Four times a day (QID) | INTRAMUSCULAR | Status: DC | PRN

## 2020-05-14 MED ORDER — AMISULPRIDE (ANTIEMETIC) 5 MG/2ML IV SOLN
5.0000 mg | Freq: Once | INTRAVENOUS | Status: AC
  Administered 2020-05-14: 5 mg via INTRAVENOUS

## 2020-05-14 MED ORDER — CEFAZOLIN SODIUM-DEXTROSE 2-4 GM/100ML-% IV SOLN
2.0000 g | INTRAVENOUS | Status: AC
  Administered 2020-05-14: 2 g via INTRAVENOUS

## 2020-05-14 MED ORDER — CEFAZOLIN SODIUM-DEXTROSE 2-4 GM/100ML-% IV SOLN
INTRAVENOUS | Status: AC
  Filled 2020-05-14: qty 100

## 2020-05-14 MED ORDER — LACTATED RINGERS IV SOLN: INTRAVENOUS | Status: DC

## 2020-05-14 MED ORDER — ORAL CARE MOUTH RINSE: 15.0000 mL | Freq: Once | OROMUCOSAL | Status: AC

## 2020-05-14 MED ORDER — CYCLOBENZAPRINE HCL 10 MG PO TABS: 10.0000 mg | ORAL_TABLET | Freq: Three times a day (TID) | ORAL | 1 refills | Status: DC | PRN

## 2020-05-14 MED ORDER — TRANEXAMIC ACID-NACL 1000-0.7 MG/100ML-% IV SOLN
INTRAVENOUS | Status: AC
  Filled 2020-05-14: qty 100

## 2020-05-14 MED ORDER — HYDROMORPHONE HCL 2 MG PO TABS
2.0000 mg | ORAL_TABLET | ORAL | 0 refills | Status: DC | PRN
Start: 2020-05-14 — End: 2021-10-16

## 2020-05-14 MED ORDER — DEXAMETHASONE SODIUM PHOSPHATE 10 MG/ML IJ SOLN
INTRAMUSCULAR | Status: DC | PRN
  Administered 2020-05-14: 5 mg via INTRAVENOUS

## 2020-05-14 MED ORDER — ROCURONIUM BROMIDE 10 MG/ML (PF) SYRINGE
PREFILLED_SYRINGE | INTRAVENOUS | Status: AC
  Filled 2020-05-14: qty 10

## 2020-05-14 MED ORDER — ONDANSETRON HCL 4 MG/2ML IJ SOLN
INTRAMUSCULAR | Status: DC | PRN
  Administered 2020-05-14: 4 mg via INTRAVENOUS

## 2020-05-14 SURGICAL SUPPLY — 73 items
ADH SKN CLS APL DERMABOND .7 (GAUZE/BANDAGES/DRESSINGS) ×1
AID PSTN UNV HD RSTRNT DISP (MISCELLANEOUS) ×1
BAG SPEC THK2 15X12 ZIP CLS (MISCELLANEOUS) ×1
BAG ZIPLOCK 12X15 (MISCELLANEOUS) ×3 IMPLANT
BLADE SAW SGTL 83.5X18.5 (BLADE) ×3 IMPLANT
BSPLAT GLND +2X24 MDLR (Joint) ×1 IMPLANT
COOLER ICEMAN CLASSIC (MISCELLANEOUS) ×3 IMPLANT
COVER BACK TABLE 60X90IN (DRAPES) ×3 IMPLANT
COVER SURGICAL LIGHT HANDLE (MISCELLANEOUS) ×3 IMPLANT
COVER WAND RF STERILE (DRAPES) ×3 IMPLANT
CUP SUT UNIV REVERS 36 NEUTRAL (Cup) ×3 IMPLANT
DERMABOND ADVANCED (GAUZE/BANDAGES/DRESSINGS) ×2
DERMABOND ADVANCED .7 DNX12 (GAUZE/BANDAGES/DRESSINGS) ×1 IMPLANT
DRAPE INCISE IOBAN 66X45 STRL (DRAPES) IMPLANT
DRAPE ORTHO SPLIT 77X108 STRL (DRAPES) ×6
DRAPE SHEET LG 3/4 BI-LAMINATE (DRAPES) ×3 IMPLANT
DRAPE SURG 17X11 SM STRL (DRAPES) ×3 IMPLANT
DRAPE SURG ORHT 6 SPLT 77X108 (DRAPES) ×2 IMPLANT
DRAPE U-SHAPE 47X51 STRL (DRAPES) ×3 IMPLANT
DRESSING AQUACEL AG SP 3.5X10 (GAUZE/BANDAGES/DRESSINGS) ×1 IMPLANT
DRSG AQUACEL AG ADV 3.5X 6 (GAUZE/BANDAGES/DRESSINGS) ×3 IMPLANT
DRSG AQUACEL AG ADV 3.5X10 (GAUZE/BANDAGES/DRESSINGS) ×3 IMPLANT
DRSG AQUACEL AG SP 3.5X10 (GAUZE/BANDAGES/DRESSINGS) ×3
DURAPREP 26ML APPLICATOR (WOUND CARE) ×3 IMPLANT
ELECT BLADE TIP CTD 4 INCH (ELECTRODE) ×3 IMPLANT
ELECT REM PT RETURN 15FT ADLT (MISCELLANEOUS) ×3 IMPLANT
FACESHIELD WRAPAROUND (MASK) ×12 IMPLANT
GLENOID UNI REV MOD 24 +2 LAT (Joint) ×3 IMPLANT
GLENOSPHERE 36 +4 LAT/24 (Joint) ×3 IMPLANT
GLOVE BIO SURGEON STRL SZ7.5 (GLOVE) ×3 IMPLANT
GLOVE BIO SURGEON STRL SZ8 (GLOVE) ×3 IMPLANT
GLOVE SS BIOGEL STRL SZ 7 (GLOVE) ×1 IMPLANT
GLOVE SS BIOGEL STRL SZ 7.5 (GLOVE) ×1 IMPLANT
GLOVE SUPERSENSE BIOGEL SZ 7 (GLOVE) ×2
GLOVE SUPERSENSE BIOGEL SZ 7.5 (GLOVE) ×2
GLOVE SURG SYN 7.0 (GLOVE) IMPLANT
GLOVE SURG SYN 7.5  E (GLOVE)
GLOVE SURG SYN 7.5 E (GLOVE) IMPLANT
GLOVE SURG SYN 8.0 (GLOVE) IMPLANT
GOWN STRL REUS W/TWL LRG LVL3 (GOWN DISPOSABLE) ×6 IMPLANT
KIT BASIN OR (CUSTOM PROCEDURE TRAY) ×3 IMPLANT
KIT TURNOVER KIT A (KITS) IMPLANT
LINER HUMERAL 36 +3MM SM (Shoulder) ×3 IMPLANT
MANIFOLD NEPTUNE II (INSTRUMENTS) ×3 IMPLANT
NEEDLE TAPERED W/ NITINOL LOOP (MISCELLANEOUS) ×3 IMPLANT
NS IRRIG 1000ML POUR BTL (IV SOLUTION) ×3 IMPLANT
PACK SHOULDER (CUSTOM PROCEDURE TRAY) ×3 IMPLANT
PAD ARMBOARD 7.5X6 YLW CONV (MISCELLANEOUS) ×3 IMPLANT
PAD COLD SHLDR WRAP-ON (PAD) ×3 IMPLANT
PIN NITINOL TARGETER 2.8 (PIN) IMPLANT
PIN SET MODULAR GLENOID SYSTEM (PIN) ×3 IMPLANT
RESTRAINT HEAD UNIVERSAL NS (MISCELLANEOUS) ×3 IMPLANT
SCREW CENTRAL MODULAR 25 (Screw) ×3 IMPLANT
SCREW PERI LOCK 5.5X16 (Screw) ×6 IMPLANT
SCREW PERI LOCK 5.5X32 (Screw) ×3 IMPLANT
SCREW PERIPHERAL 5.5X28 LOCK (Screw) ×3 IMPLANT
SLING ARM FOAM STRAP LRG (SOFTGOODS) IMPLANT
SLING ARM FOAM STRAP MED (SOFTGOODS) ×3 IMPLANT
SLING ARM IMMOBILIZER MED (SOFTGOODS) ×3 IMPLANT
SPONGE LAP 18X18 RF (DISPOSABLE) IMPLANT
STEM HUMERAL UNI REVERS SZ6 (Stem) ×3 IMPLANT
SUCTION FRAZIER HANDLE 12FR (TUBING) ×3
SUCTION TUBE FRAZIER 12FR DISP (TUBING) ×1 IMPLANT
SUT FIBERWIRE #2 38 T-5 BLUE (SUTURE)
SUT MNCRL AB 3-0 PS2 18 (SUTURE) ×3 IMPLANT
SUT MON AB 2-0 CT1 36 (SUTURE) ×3 IMPLANT
SUT VIC AB 1 CT1 36 (SUTURE) ×3 IMPLANT
SUTURE FIBERWR #2 38 T-5 BLUE (SUTURE) IMPLANT
SUTURE TAPE 1.3 40 TPR END (SUTURE) ×2 IMPLANT
SUTURETAPE 1.3 40 TPR END (SUTURE) ×6
TOWEL OR 17X26 10 PK STRL BLUE (TOWEL DISPOSABLE) ×3 IMPLANT
TOWEL OR NON WOVEN STRL DISP B (DISPOSABLE) ×3 IMPLANT
WATER STERILE IRR 1000ML POUR (IV SOLUTION) ×6 IMPLANT

## 2020-05-14 NOTE — H&P (Signed)
Karen Woods    Chief Complaint: right shoulder osteoarthritis HPI: The patient is a 74 y.o. female with chronic and progressively increasing right shoulder pain and associated functional limitations related to advanced osteoarthritis and associated rotator cuff dysfunction  Past Medical History:  Diagnosis Date  . Anemia   . Arthritis   . Back pain   . Cataracts, bilateral   . COPD (chronic obstructive pulmonary disease) (HCC)    Mild  . Coronary artery disease   . Depression   . Dyspnea    with exertion  . GERD (gastroesophageal reflux disease)   . Heart murmur   . History of aortic stenosis    Severe  . History of prolapse of bladder   . Hyperlipidemia   . Hypertension   . Osteoporosis   . Pneumonia   . PONV (postoperative nausea and vomiting)   . Pre-diabetes   . Small vessel disease, cerebrovascular   . Vertebrobasilar artery insufficiency   . Vertigo     Past Surgical History:  Procedure Laterality Date  . ABDOMINAL HYSTERECTOMY    . Ablation on neck    . AORTIC VALVE REPLACEMENT (AVR)/CORONARY ARTERY BYPASS GRAFTING (CABG)  10/19/2013  . BACK SURGERY  03/18/2015  . BLEPHAROPLASTY    . CARDIAC CATHETERIZATION    . COLONOSCOPY  12/09/2007  . SACROSPINOUS LIGAMENT FIXATION  06/25/2016  . TONSILLECTOMY    . TOTAL HIP ARTHROPLASTY Left 10/03/2018   Procedure: TOTAL HIP ARTHROPLASTY ANTERIOR APPROACH;  Surgeon: Ollen Gross, MD;  Location: [facility];  Service: Orthopedics;  Laterality: Left;   . TOTAL HIP ARTHROPLASTY Right 05/31/2019   Procedure: TOTAL HIP ARTHROPLASTY ANTERIOR APPROACH;  Surgeon: Ollen Gross, MD;  Location: [facility];  Service: Orthopedics;  Laterality: Right;   . WISDOM TOOTH EXTRACTION      History reviewed. No pertinent family history.  Social History:  reports that she has quit smoking. She has never used smokeless tobacco. She reports previous alcohol use. She reports that she does not use drugs.   Medications Prior to  Admission  Medication Sig Dispense Refill  . acetaminophen (TYLENOL) 650 MG CR tablet Take 650-1,300 mg by mouth See admin instructions. Take 650 mg in the morning and 1300 mg at bedtime    . albuterol (PROVENTIL) (5 MG/ML) 0.5% nebulizer solution Take 2.5 mg by nebulization every 6 (six) hours as needed for wheezing or shortness of breath.    Marland Kitchen albuterol (VENTOLIN HFA) 108 (90 Base) MCG/ACT inhaler Inhale 1-2 puffs into the lungs every 6 (six) hours as needed for wheezing or shortness of breath.    Marland Kitchen aspirin EC 81 MG tablet Take 81 mg by mouth daily. Swallow whole.    . cycloSPORINE (RESTASIS) 0.05 % ophthalmic emulsion Place 1 drop into both eyes 2 (two) times daily.    Marland Kitchen estradiol (ESTRACE) 0.5 MG tablet Take 0.5 mg by mouth daily.    Marland Kitchen gabapentin (NEURONTIN) 800 MG tablet Take 800 mg by mouth 3 (three) times daily.     Marland Kitchen HYDROcodone-acetaminophen (NORCO/VICODIN) 5-325 MG tablet Take 1 tablet by mouth 3 (three) times daily as needed.    . metoprolol tartrate (LOPRESSOR) 25 MG tablet Take 25 mg by mouth 2 (two) times daily.    . Multiple Vitamins-Minerals (MULTIVITAMIN WITH MINERALS) tablet Take 1 tablet by mouth daily.    Marland Kitchen omeprazole (PRILOSEC) 40 MG capsule Take 40 mg by mouth daily.    . rosuvastatin (CRESTOR) 40 MG tablet Take 40 mg by mouth daily.  Physical Exam: Right shoulder demonstrates painful and profoundly restricted mobility as noted at recent office visits.  Diagnostic imaging confirms changes consistent with severe arthritis including bony deformity and subchondral sclerosis with osteophytic spurring.  Vitals  Temp:  [98.4 F (36.9 C)] 98.4 F (36.9 C) (08/24 0623) Pulse Rate:  [55] 55 (08/24 0623) Resp:  [16] 16 (08/24 0623) BP: (168)/(77) 168/77 (08/24 0623) SpO2:  [99 %] 99 % (08/24 2010)  Assessment/Plan  Impression: right shoulder osteoarthritis  Plan of Action: Procedure(s): REVERSE SHOULDER ARTHROPLASTY  Karen Woods M Ko Bardon 05/14/2020, 6:35 AM Contact #  [phone]

## 2020-05-14 NOTE — Progress Notes (Signed)
Occupational Therapy Evaluation   s/p shoulder replacement without functional use of right dominant upper extremity secondary to effects of surgery and interscalene block and shoulder precautions. Therapist provided education and instruction to patient in regards to exercises, precautions, positioning, donning upper extremity clothing and bathing while maintaining shoulder precautions, ice and edema management and donning/doffing sling. Patient  verbalized understanding. As OT attempt to initiate dressing and functional mobility, patient sits forward in chair complaining of nausea then states "I'm going out" then passes out in chair. DTR present and assist with reclining patient in chair with nursing notified. Patient left reclined in chair with nursing staff attending to patient's medical needs, had regained consciousness before OT left pt room.     05/14/20 1400  OT Visit Information  Last OT Received On 05/14/20  Assistance Needed +1  History of Present Illness Patient is a 74 year old female admitted for R reverse total shoulder arthroplasty. PMH includes not limited to CABG, B THA, 3 back surgeries  Precautions  Precautions Shoulder  Type of Shoulder Precautions AROM elbow wrist and hand ok, pendulums and lap slides ok, P/AA/AROM SH FF 60 ABD 45 ER 20 ok for ADLs   Shoulder Interventions Shoulder sling/immobilizer;Off for dressing/bathing/exercises (off in controlled environment )  Precaution Booklet Issued Yes (comment)  Required Braces or Orthoses Sling  Restrictions  Weight Bearing Restrictions Yes  RUE Weight Bearing NWB  Home Living  Family/patient expects to be discharged to: Private residence  Living Arrangements Alone  Available Help at Discharge Family  Type of Home Mobile home  Home Access Stairs to enter  Entrance Stairs-Number of Steps 3  Entrance Stairs-Rails Right  Home Layout One level  Bathroom Shower/Tub Walk-in shower  Bathroom Toilet Handicapped height  Home  Equipment [place] - 2 wheels;Toilet riser;Shower seat (bed rail)  Prior Function  Level of Independence Independent  Communication  Communication No difficulties  Pain Assessment  Pain Assessment No/denies pain  Cognition  Arousal/Alertness Awake/alert  Behavior During Therapy WFL for tasks assessed/performed  Overall Cognitive Status Within Functional Limits for tasks assessed  Upper Extremity Assessment  Upper Extremity Assessment RUE deficits/detail  RUE Deficits / Details + nerve block  ADL  General ADL Comments unable to assess dressing as patient lost consciousness after shoulder education completed, RN present to provide medical attention  Transfers  General transfer comment unable to assess due to patient losing consciousness   Balance  Overall balance assessment  (unable to assess)  Exercises  Exercises Shoulder;Other exercises  Shoulder Instructions  Donning/doffing shirt without moving shoulder Patient able to independently direct caregiver  Method for sponge bathing under operated UE Patient able to independently direct caregiver  Donning/doffing sling/immobilizer Patient able to independently direct caregiver  Correct positioning of sling/immobilizer Patient able to independently direct caregiver  Pendulum exercises (written home exercise program) Patient able to independently direct caregiver  ROM for elbow, wrist and digits of operated UE Patient able to independently direct caregiver  Sling wearing schedule (on at all times/off for ADL's) Patient able to independently direct caregiver  Proper positioning of operated UE when showering Patient able to independently direct caregiver  Positioning of UE while sleeping Patient able to independently direct caregiver  Other Exercises  Other Exercises patient instructed in prescribed exercises, unable to perform due to nerve block  OT - End of Session  Equipment Utilized During Treatment  (sling)  Activity Tolerance  Treatment limited secondary to medical complications (Comment) (patient lost consiousness)  Patient left in chair;with nursing/sitter  in room;with family/visitor present  Nurse Communication Other (comment) (RN present at end of session due to patient passing out)  OT Assessment  OT Recommendation/Assessment Progress rehab of shoulder as ordered by MD at follow-up appointment  OT Visit Diagnosis Pain  Pain - Right/Left Right  Pain - part of body Shoulder  OT Problem List Pain;Impaired UE functional use  AM-PAC OT "6 Clicks" Daily Activity Outcome Measure (Version 2)  Help from another person eating meals? 3  Help from another person taking care of personal grooming? 3  Help from another person toileting, which includes using toliet, bedpan, or urinal? 1 (unable to evaluate)  Help from another person bathing (including washing, rinsing, drying)? 1 (unable to evaluate)  Help from another person to put on and taking off regular upper body clothing? 1 (unable to evaluate)  Help from another person to put on and taking off regular lower body clothing? 1 (unable to evaluate)  6 Click Score 10  OT Recommendation  Follow Up Recommendations Follow surgeons recommendation for DC plan and follow-up therapies  OT Equipment None recommended by OT  Acute Rehab OT Goals  Patient Stated Goal "I feel nauseous"  OT Goal Formulation With patient  OT Time Calculation  OT Start Time (ACUTE ONLY) 1122  OT Stop Time (ACUTE ONLY) 1153  OT Time Calculation (min) 31 min  OT General Charges  $OT Visit 1 Visit  OT Evaluation  $OT Eval Low Complexity 1 Low  OT Treatments  $Self Care/Home Management  8-22 mins  Written Expression  Dominant Hand Right   Marlyce Huge OT OT pager: [phone]

## 2020-05-14 NOTE — Anesthesia Procedure Notes (Addendum)
Anesthesia Regional Block: Interscalene brachial plexus block   Pre-Anesthetic Checklist: ,, timeout performed, Correct Patient, Correct Site, Correct Laterality, Correct Procedure, Correct Position, site marked, Risks and benefits discussed,  Surgical consent,  Pre-op evaluation,  At surgeon's request and post-op pain management  Laterality: Right  Prep: chloraprep       Needles:  Injection technique: Single-shot  Needle Type: Stimulator Needle - 40      Needle Gauge: 22     Additional Needles:   Procedures:, nerve stimulator,,,,,,,  Narrative:  Start time: 05/14/2020 7:00 AM End time: 05/14/2020 7:10 AM Injection made incrementally with aspirations every 5 mL.  Performed by: Personally  Anesthesiologist: Kipp Brood, MD  Additional Notes: 20 cc 0.5% Bupivacaine 1:200 epi 10 cc 1.3% Exparel

## 2020-05-14 NOTE — Discharge Instructions (Signed)
 Kevin M. Supple, M.D., F.A.A.O.S. Orthopa[facility] Surgery Specializing in Arthroscopic and Reconstructive Surgery of the Shoulder [phone] [address] - [place], [place] [address] - Fax [phone]   POST-OP TOTAL SHOULDER REPLACEMENT INSTRUCTIONS  1. Follow up in the office for your first post-op appointment 10-14 days from the date of your surgery. If you do not already have a scheduled appointment, our office will contact you to schedule.  2. The bandage over your incision is waterproof. You may begin showering with this dressing on. You may leave this dressing on until first follow up appointment within 2 weeks. We prefer you leave this dressing in place until follow up however after 5-7 days if you are having itching or skin irritation and would like to remove it you may do so. Go slow and tug at the borders gently to break the bond the dressing has with the skin. At this point if there is no drainage it is okay to go without a bandage or you may cover it with a light guaze and tape. You can also expect significant bruising around your shoulder that will drift down your arm and into your chest wall. This is very normal and should resolve over several days.   3. Wear your sling/immobilizer at all times except to perform the exercises below or to occasionally let your arm dangle by your side to stretch your elbow. You also need to sleep in your sling immobilizer until instructed otherwise. It is ok to remove your sling if you are sitting in a controlled environment and allow your arm to rest in a position of comfort by your side or on your lap with pillows to give your neck and skin a break from the sling. You may remove it to allow arm to dangle by side to shower. If you are up walking around and when you go to sleep at night you need to wear it.  4. Range of motion to your elbow, wrist, and hand are encouraged 3-5 times daily. Exercise to your hand and fingers helps to reduce  swelling you may experience.   5. Prescriptions for a pain medication and a muscle relaxant are provided for you. It is recommended that if you are experiencing pain that you pain medication alone is not controlling, add the muscle relaxant along with the pain medication which can give additional pain relief. The first 1-2 days is generally the most severe of your pain and then should gradually decrease. As your pain lessens it is recommended that you decrease your use of the pain medications to an "as needed basis'" only and to always comply with the recommended dosages of the pain medications.  6. Pain medications can produce constipation along with their use. If you experience this, the use of an over the counter stool softener or laxative daily is recommended.   7. For additional questions or concerns, please do not hesitate to call the office. If after hours there is an answering service to forward your concerns to the physician on call.  8.Pain control following an exparel block  To help control your post-operative pain you received a nerve block  performed with Exparel which is a long acting anesthetic (numbing agent) which can provide pain relief and sensations of numbness (and relief of pain) in the operative shoulder and arm for up to 3 days. Sometimes it provides mixed relief, meaning you may still have numbness in certain areas of the arm but can still be able to   move  parts of that arm, hand, and fingers. We recommend that your prescribed pain medications  be used as needed. We do not feel it is necessary to "pre medicate" and "stay ahead" of pain.  Taking narcotic pain medications when you are not having any pain can lead to unnecessary and potentially dangerous side effects.    9. Use the ice machine as much as possible in the first 5-7 days from surgery, then you can wean its use to as needed. The ice typically needs to be replaced every 6 hours, instead of ice you can actually freeze  water bottles to put in the cooler and then fill water around them to avoid having to purchase ice. You can have spare water bottles freezing to allow you to rotate them once they have melted. Try to have a thin shirt or light cloth or towel under the ice wrap to protect your skin.   10.  We recommend that you avoid any dental work or cleaning in the first 3 months following your joint replacement. This is to help minimize the possibility of infection from the bacteria in your mouth that enters your bloodstream during dental work. We also recommend that you take an antibiotic prior to your dental work for the first year after your shoulder replacement to further help reduce that risk. Please simply contact our office for antibiotics to be sent to your pharmacy prior to dental work.  11. Dental Antibiotics:  In most cases prophylactic antibiotics for Dental procdeures after total joint surgery are not necessary.  Exceptions are as follows:  1. History of prior total joint infection  2. Severely immunocompromised (Organ Transplant, cancer chemotherapy, Rheumatoid biologic meds such as Humera)  3. Poorly controlled diabetes (A1C &gt; 8.0, blood glucose over 200)  If you have one of these conditions, contact your surgeon for an antibiotic prescription, prior to your dental procedure.   POST-OP EXERCISES  Pendulum Exercises  Perform pendulum exercises while standing and bending at the waist. Support your uninvolved arm on a table or chair and allow your operated arm to hang freely. Make sure to do these exercises passively - not using you shoulder muscles. These exercises can be performed once your nerve block effects have worn off.  Repeat 20 times. Do 3 sessions per day.     

## 2020-05-14 NOTE — Anesthesia Postprocedure Evaluation (Signed)
Anesthesia Post Note  Patient: Karen Woods  Procedure(s) Performed: REVERSE SHOULDER ARTHROPLASTY (Right Shoulder)     Patient location during evaluation: PACU Anesthesia Type: General and Regional Level of consciousness: awake and alert Pain management: pain level controlled Vital Signs Assessment: post-procedure vital signs reviewed and stable Respiratory status: spontaneous breathing, nonlabored ventilation, respiratory function stable and patient connected to nasal cannula oxygen Cardiovascular status: blood pressure returned to baseline and stable Postop Assessment: no apparent nausea or vomiting Anesthetic complications: no   No complications documented.  Last Vitals:  Vitals:   05/14/20 1014 05/14/20 1036  BP: (!) 144/94 (!) 143/80  Pulse: (!) 51 (!) 49  Resp: 16 18  Temp: (!) 36.3 C (!) 36.3 C  SpO2: 97% 98%    Last Pain:  Vitals:   05/14/20 1036  TempSrc:   PainSc: 0-No pain                 Lakynn Halvorsen COKER

## 2020-05-14 NOTE — Anesthesia Procedure Notes (Signed)
Procedure Name: Intubation Date/Time: 05/14/2020 7:39 AM Performed by: Raenette Rover, CRNA Pre-anesthesia Checklist: Patient identified, Emergency Drugs available, Suction available and Patient being monitored Patient Re-evaluated:Patient Re-evaluated prior to induction Oxygen Delivery Method: Circle system utilized Preoxygenation: Pre-oxygenation with 100% oxygen Induction Type: IV induction Ventilation: Mask ventilation without difficulty Laryngoscope Size: Mac and 3 Grade View: Grade I Tube type: Oral Tube size: 7.0 mm Number of attempts: 1 Airway Equipment and Method: Stylet Placement Confirmation: ETT inserted through vocal cords under direct vision,  breath sounds checked- equal and bilateral and positive ETCO2 Secured at: 21 cm Tube secured with: Tape Dental Injury: Teeth and Oropharynx as per pre-operative assessment

## 2020-05-14 NOTE — Op Note (Signed)
05/14/2020  8:57 AM  PATIENT:   Karen Woods  74 y.o. female  PRE-OPERATIVE DIAGNOSIS:  right shoulder osteoarthritis  POST-OPERATIVE DIAGNOSIS: Same  PROCEDURE: Right shoulder reverse arthroplasty utilizing a press-fit size 6 Arthrex stem with a +3 polyethylene insert, neutral metaphysis, 36/+4 glenosphere on a small/+2 baseplate  SURGEON:  Jveon Pound, Vania Rea M.D.  ASSISTANTS: Ralene Bathe, PA-C   ANESTHESIA:   General endotracheal and interscalene block with Exparel  EBL: Approximately 100 cc  SPECIMEN: None  Drains: None   PATIENT DISPOSITION:  PACU - hemodynamically stable.    PLAN OF CARE: Discharge to home after PACU  Brief history:  Karen Woods is a 74 year old female who has had chronic and progressive increasing right shoulder pain related to advanced osteoarthritis and associated rotator cuff dysfunction.  Due to her increasing pain and functional imitations and failure to respond to conservative management she is brought to the operating this time for planned right shoulder reverse arthroplasty.  Preoperatively, I counseled the patient regarding treatment options and risks versus benefits thereof.  Possible surgical complications were all reviewed including potential for bleeding, infection, neurovascular injury, persistent pain, loss of motion, anesthetic complication, failure of the implant, and possible need for additional surgery. They understand and accept and agrees with our planned procedure.   Procedure in detail:  After undergoing routine preop evaluation patient received prophylactic antibiotics and interscalene block with Exparel was established in the holding area by the anesthesia department.  Patient subsequently placed spine on the operating table and underwent the smooth induction of a general endotracheal anesthesia.  Placed into the beachchair position and appropriately padded and protected.  The right shoulder girdle region was sterilely prepped and  draped in standard fashion.  Timeout was called.  An anterior deltopectoral approach to the right shoulder is made through an approximate centimeter incision.  Skin flaps elevated dissection carried deeply and the deltopectoral interval was developed from proximal to distal with the vein taken laterally.  The conjoined tendon was mobilized and retracted medially and adhesions were divided from beneath the deltoid.  The upper centimeter of the pectoralis major tendon was divided to improve exposure.  The long head biceps tendon was then unroofed and tenodesed at the upper border the pectoralis major tendon and the proximal segment was then excised.  The rotator cuff was split along the rotator interval to the base of the coracoid and the subscapularis was then divided away from the left tuberosity and the free margin was tagged with a pair of suture tape sutures.  We then divided the capsular attachments from the anterior inferior margins of the humeral neck and humeral head was then delivered through the wound.  We utilized an extra medullary guide to outline our proposed humeral head resection which we then performed with an oscillating saw.  The remaining osteophytes on the margins of the humeral neck were removed with a rondure.  A metal cap was then placed over the cut proximal humeral surface.  At this point the glenoid was exposed with appropriate retractors.  Performed a circumferential labral resection gaining complete visualization of the entire periphery of the glenoid.  A guidepin was then directed into the center of the glenoid with a approximately 10 degree inferior tilt and the glenoid was then reamed to a stable subchondral bony bed.  Both the central and peripheral reamers were utilized.  Glenoid preparation was then completed with the central drill and tap.  A 25 mm lag screw was selected.  The baseplate  was then inserted with excellent fit and fixation.  The peripheral locking screws were all then  placed obtaining excellent purchase and fixation.  A 36/+4 glenosphere was then impacted onto the baseplate and the central locking screw was then placed.  We then returned our attention back to the proximal humerus where the canal was opened we broached up to a size 6 at the native retroversion of approximate 25 degrees.  A neutral metaphyseal reaming guide was then utilized to prepare the metaphysis.  A trial reduction was then performed showing excellent motion good stability good soft tissue balance.  The trial was then removed.  The final implant was assembled.  It was then impacted with excellent fit and fixation.  Trial reduction at this point showed excellent soft tissue balance with the +3 poly-.  The trial was removed the final +3 polywas then impacted after the implant was cleaned and dried.  The final polywas impacted final reduction was performed and again very pleased with overall motion, stability, and soft tissue balance.  Wound was copiously irrigated.  Hemostasis was obtained.  The subscapularis was mobilized and then repaired back to the collar of our implant utilizing the eyelets on the collar using the previously placed suture tape sutures.  At this point the arm easily achieved 30 degrees of ex rotation without excessive tension on the repair.  The deltopectoral interval was then reapproximated with a series of figure-of-eight and 1 Vicryl sutures.  2-0 Vicryl used for the subcu layer and intracuticular 3 Monocryl the skin followed by Dermabond and Aquacel dressing.  Right arm is placed in a sling.  Patient was awakened, extubated, and taken to the recovery room in stable condition.  Ralene Bathe, PA-C was utilized as an Geophysicist/field seismologist throughout this case, essential for help with positioning the patient, positioning extremity, tissue manipulation, implantation of the prosthesis, suture management, wound closure, and intraoperative decision-making.  Senaida Lange MD   Contact #  [phone]

## 2020-05-14 NOTE — Transfer of Care (Signed)
Immediate Anesthesia Transfer of Care Note  Patient: Karen Woods  Procedure(s) Performed: REVERSE SHOULDER ARTHROPLASTY (Right Shoulder)  Patient Location: PACU  Anesthesia Type:GA combined with regional for post-op pain  Level of Consciousness: drowsy and patient cooperative  Airway & Oxygen Therapy: Patient Spontanous Breathing and Patient connected to face mask oxygen  Post-op Assessment: Report given to RN and Post -op Vital signs reviewed and stable  Post vital signs: Reviewed and stable  Last Vitals:  Vitals Value Taken Time  BP 127/68 05/14/20 0916  Temp    Pulse 50 05/14/20 0919  Resp 12 05/14/20 0919  SpO2 99 % 05/14/20 0919  Vitals shown include unvalidated device data.  Last Pain:  Vitals:   05/14/20 0623  TempSrc: Oral         Complications: No complications documented.

## 2020-05-15 ENCOUNTER — Encounter (HOSPITAL_COMMUNITY): Payer: Self-pay | Admitting: Orthopedic Surgery

## 2020-11-11 ENCOUNTER — Other Ambulatory Visit (HOSPITAL_COMMUNITY): Payer: Self-pay | Admitting: Orthopedic Surgery

## 2020-11-11 ENCOUNTER — Other Ambulatory Visit: Payer: Self-pay | Admitting: Orthopedic Surgery

## 2020-11-11 DIAGNOSIS — Z96611 Presence of right artificial shoulder joint: Secondary | ICD-10-CM

## 2020-11-18 ENCOUNTER — Encounter (HOSPITAL_COMMUNITY)
Admission: RE | Admit: 2020-11-18 | Discharge: 2020-11-18 | Disposition: A | Discharge status: Home / Self Care | Payer: Medicare Other | Source: Ambulatory Visit | Attending: Orthopedic Surgery | Admitting: Orthopedic Surgery

## 2020-11-18 ENCOUNTER — Other Ambulatory Visit: Payer: Self-pay

## 2020-11-18 DIAGNOSIS — Z96611 Presence of right artificial shoulder joint: Secondary | ICD-10-CM | POA: Insufficient documentation

## 2020-11-18 MED ORDER — TECHNETIUM TC 99M MEDRONATE IV KIT
21.2000 | PACK | Freq: Once | INTRAVENOUS | Status: AC | PRN
  Administered 2020-11-18: 21.2 via INTRAVENOUS

## 2021-02-13 ENCOUNTER — Encounter: Payer: Self-pay | Admitting: *Deleted

## 2021-02-13 ENCOUNTER — Other Ambulatory Visit: Payer: Self-pay | Admitting: *Deleted

## 2021-02-14 ENCOUNTER — Encounter: Payer: Self-pay | Admitting: Neurology

## 2021-02-14 ENCOUNTER — Ambulatory Visit (INDEPENDENT_AMBULATORY_CARE_PROVIDER_SITE_OTHER): Payer: Medicare Other | Admitting: Neurology

## 2021-02-14 VITALS — BP 152/81 | HR 61 | Ht 64.0 in | Wt 160.2 lb

## 2021-02-14 DIAGNOSIS — R251 Tremor, unspecified: Secondary | ICD-10-CM | POA: Diagnosis not present

## 2021-02-14 NOTE — Progress Notes (Addendum)
Chief Complaint  Patient presents with  . Tremors    New Pt "tremor in right hand began Aug 2021 after shoulder surgery; gets worse when I am tired; had nerve test that showed carpal tunnel- scheduled for surgery"      ASSESSMENT AND PLAN  Karen Woods is a 75 y.o. female   Right hand posturing tremor  Patient reported that this happened 2 weeks following her right shoulder reverse replacement surgery in August 2021,  Examination is limited by her shoulder pain, limited range of motion, severe osteoarthritis, multiple hands joints pain, deformity, she has posturing tremor of right hand, not the left hand, no bradykinesia, no rigidity, right hand tremor is clearly associated with position of her right shoulder and elbow, but there was no tremor when she writes in fine print.  Her tremors are likely due to her pain, muscle strain, there was no parkinsonian features on examination  I have discussed with patient, decided to hold off medication management at this point, will call clinic for worsening symptoms,  DIAGNOSTIC DATA (LABS, IMAGING, TESTING) - I reviewed patient records, labs, notes, testing and imaging myself where available.  Laboratory evaluation August 2021: Normal CBC, BMP, A1c was 5.9 HISTORICAL  Karen Woods is a 75 year old female, seen in request by her orthopedic surgeon Dr.   Francena Hanly, and primary care physician Dr. Susa Loffler C. for evaluation of right hand tremor, initial evaluation was on Feb 14, 2021  I reviewed and summarized the referring note.  Past medical history Hyperlipidemia Hypertension Coronary artery disease, status post CABG Aortic Valve Replacement Lumbar decompression surgery Right reverse shoulder arthroplasty May 14 2020 Left hip replacement January 2020, Right hip replacement September 2020 Osteoarthritis,   She has a history of severe osteoarthritis, multiple deformed bilateral hands joints, significant multiple large and small  joints pain, underwent bilateral hip replacement, and also right shoulder reverse arthroplasty surgery in August 2021, prior to the surgery, she has significant limitation of her right hand and arm due to severe right shoulder pain  Postsurgical about 2 weeks, she noticed right hand tremor, it only happened when she stretched out her right hand, especially abduct her right arm away from her right shoulder, over the past few months, there was no significant worsening, if she will hold her right elbow close to her body, rest her right arm and hand on the table, she can study her hand  In addition she complains of worsening bilateral hands joints pain, especially right hand, numbness tingling, was diagnosed with right carpal tunnel, planning on to have right carpal tunnel decompression surgery by orthopedic surgeon Dr. Orlan Leavens soon,  She denies significant neck pain, no bowel bladder incontinence, she has mild unsteady gait due to bilateral hip replacement, bilateral hip pain  PHYSICAL EXAM:   Vitals:   02/14/21 1054  BP: (!) 152/81  Pulse: 61  Weight: 160 lb 3.2 oz (72.7 kg)  Height: 5\' 4"  (1.626 m)   Not recorded     Body mass index is 27.5 kg/m.  PHYSICAL EXAMNIATION:  Gen: NAD, conversant, well nourised, well groomed                     Cardiovascular: Regular rate rhythm, no peripheral edema, warm, nontender. Eyes: Conjunctivae clear without exudates or hemorrhage Neck: Supple, no carotid bruits. Pulmonary: Clear to auscultation bilaterally   NEUROLOGICAL EXAM:  MENTAL STATUS: Speech:    Speech is normal; fluent and spontaneous with normal comprehension.  Cognition:  Orientation to time, place and person     Normal recent and remote memory     Normal Attention span and concentration     Normal Language, naming, repeating,spontaneous speech     Fund of knowledge   CRANIAL NERVES: CN II: Visual fields are full to confrontation. Pupils are round equal and briskly  reactive to light. CN III, IV, VI: extraocular movement are normal. No ptosis. CN V: Facial sensation is intact to light touch CN VII: Face is symmetric with normal eye closure  CN VIII: Hearing is normal to causal conversation. CN IX, X: Phonation is normal. CN XI: Head turning and shoulder shrug are intact  MOTOR: Deformity of bilateral hands joints, difficult to make a tight grip, right hand posturing kinetic tremor, most noticeable when stretching out her right arm away from her right shoulder, improved when she had right shoulder adduction, resting right elbow close to her body, there was no rigidity, bradykinesia,   REFLEXES: Reflexes are hypoactive and symmetric at the biceps, triceps, knees, and ankles. Plantar responses are flexor.  SENSORY: Intact to light touch, pinprick and vibratory sensation are intact in fingers and toes.  COORDINATION: There is no trunk or limb dysmetria noted.  GAIT/STANCE: She needs push-up to get up from seated position, wide-based, mildly unsteady  REVIEW OF SYSTEMS:  Full 14 system review of systems performed and notable only for as above All other review of systems were negative.   ALLERGIES: Allergies  Allergen Reactions  . Sulfamethoxazole Anaphylaxis  . Morphine And Related Nausea And Vomiting  . Oxycodone Other (See Comments)    Out of it  . Codeine Rash    HOME MEDICATIONS: Current Outpatient Medications  Medication Sig Dispense Refill  . acetaminophen (TYLENOL) 650 MG CR tablet Take 650-1,300 mg by mouth See admin instructions. Take 650 mg in the morning and 1300 mg at bedtime    . albuterol (PROVENTIL) (5 MG/ML) 0.5% nebulizer solution Take 2.5 mg by nebulization every 6 (six) hours as needed for wheezing or shortness of breath.    Marland Kitchen albuterol (VENTOLIN HFA) 108 (90 Base) MCG/ACT inhaler Inhale 1-2 puffs into the lungs every 6 (six) hours as needed for wheezing or shortness of breath.    Marland Kitchen aspirin EC 81 MG tablet Take 81 mg by  mouth daily. Swallow whole.    . baclofen (LIORESAL) 10 MG tablet Take 10 mg by mouth 2 (two) times daily.    . cyclobenzaprine (FLEXERIL) 10 MG tablet Take 1 tablet (10 mg total) by mouth 3 (three) times daily as needed for muscle spasms. 30 tablet 1  . cycloSPORINE (RESTASIS) 0.05 % ophthalmic emulsion Place 1 drop into both eyes 2 (two) times daily.    Marland Kitchen estradiol (ESTRACE) 0.5 MG tablet Take 0.5 mg by mouth daily.    . fluorometholone (FML) 0.1 % ophthalmic suspension fluorometholone 0.1 % eye drops,suspension    . gabapentin (NEURONTIN) 800 MG tablet Take 800 mg by mouth 3 (three) times daily.     Marland Kitchen HYDROcodone-acetaminophen (NORCO/VICODIN) 5-325 MG tablet Take 1 tablet by mouth 3 (three) times daily as needed for moderate pain. 30 tablet 0  . HYDROcodone-acetaminophen (NORCO/VICODIN) 5-325 MG tablet hydrocodone 5 mg-acetaminophen 325 mg tablet  Take 1 tablet every 4 hours by oral route as needed.    Marland Kitchen HYDROmorphone (DILAUDID) 2 MG tablet Take 1 tablet (2 mg total) by mouth every 4 (four) hours as needed (for severe post op pain not controlled by norco). 10 tablet 0  .  hydroxychloroquine (PLAQUENIL) 200 MG tablet hydroxychloroquine 200 mg tablet  TAKE 1 TABLET BY MOUTH ONCE DAILY    . metoprolol tartrate (LOPRESSOR) 25 MG tablet Take 25 mg by mouth 2 (two) times daily.    . Multiple Vitamins-Minerals (MULTIVITAMIN WITH MINERALS) tablet Take 1 tablet by mouth daily.    . naproxen (NAPROSYN) 500 MG tablet Take 1 tablet (500 mg total) by mouth 2 (two) times daily with a meal. 60 tablet 1  . omeprazole (PRILOSEC) 40 MG capsule Take 40 mg by mouth daily.    . rosuvastatin (CRESTOR) 40 MG tablet Take 40 mg by mouth daily.    . SYMBICORT 160-4.5 MCG/ACT inhaler SMARTSIG:2 Puff(s) By Mouth Twice Daily    . ondansetron (ZOFRAN) 4 MG tablet Take 1 tablet (4 mg total) by mouth every 8 (eight) hours as needed for nausea or vomiting. (Patient not taking: Reported on 02/14/2021) 10 tablet 0   No current  facility-administered medications for this visit.    PAST MEDICAL HISTORY: Past Medical History:  Diagnosis Date  . Anemia   . Arthritis   . Back pain   . Cataracts, bilateral   . Chronic pain   . COPD (chronic obstructive pulmonary disease) (HCC)    Mild  . Coronary artery disease   . Depression   . Dyspnea    with exertion  . GERD (gastroesophageal reflux disease)   . Heart murmur   . History of aortic stenosis    Severe  . History of prolapse of bladder   . Hyperlipidemia   . Hypertension   . Osteoporosis   . Pneumonia   . PONV (postoperative nausea and vomiting)   . Pre-diabetes   . Small vessel disease, cerebrovascular   . Tremor   . Vertebrobasilar artery insufficiency   . Vertigo     PAST SURGICAL HISTORY: Past Surgical History:  Procedure Laterality Date  . ABDOMINAL HYSTERECTOMY    . Ablation on neck    . AORTIC VALVE REPLACEMENT (AVR)/CORONARY ARTERY BYPASS GRAFTING (CABG)  10/19/2013  . BACK SURGERY  03/18/2015  . BLEPHAROPLASTY    . CARDIAC CATHETERIZATION    . COLONOSCOPY  12/09/2007  . REVERSE SHOULDER ARTHROPLASTY Right 05/14/2020   Procedure: REVERSE SHOULDER ARTHROPLASTY;  Surgeon: Francena HanlySupple, Kevin, MD;  Location: [facility];  Service: Orthopedics;  Laterality: Right;  120min  . SACROSPINOUS LIGAMENT FIXATION  06/25/2016  . TONSILLECTOMY    . TOTAL HIP ARTHROPLASTY Left 10/03/2018   Procedure: TOTAL HIP ARTHROPLASTY ANTERIOR APPROACH;  Surgeon: Ollen GrossAluisio, Frank, MD;  Location: [facility];  Service: Orthopedics;  Laterality: Left;  100min  . TOTAL HIP ARTHROPLASTY Right 05/31/2019   Procedure: TOTAL HIP ARTHROPLASTY ANTERIOR APPROACH;  Surgeon: Ollen GrossAluisio, Frank, MD;  Location: [facility];  Service: Orthopedics;  Laterality: Right;  100min  . WISDOM TOOTH EXTRACTION      FAMILY HISTORY: History reviewed. No pertinent family history.  SOCIAL HISTORY: Social History   Socioeconomic History  . Marital status: Married    Spouse name: Not on file  . Number of  children: 1  . Years of education: Not on file  . Highest education level: 8th grade  Occupational History  . Occupation: Comptrollersitter part time  Tobacco Use  . Smoking status: Former Games developermoker  . Smokeless tobacco: Never Used  . Tobacco comment: Quit 20 years ago  Vaping Use  . Vaping Use: Never used  Substance and Sexual Activity  . Alcohol use: Not Currently  . Drug use: Never  . Sexual activity:  Not on file  Other Topics Concern  . Not on file  Social History Narrative   Lives alone   Social Determinants of Health   Financial Resource Strain: Not on file  Food Insecurity: Not on file  Transportation Needs: Not on file  Physical Activity: Not on file  Stress: Not on file  Social Connections: Not on file  Intimate Partner Violence: Not on file      Levert Feinstein, M.D. Ph.D.  [facility] [address], [address] [place], [place] [address] Ph: [phone] Fax: [phone]  CC:  Francena Hanly, MD [address] [place],  [place] [address]  Ignacia Palma., MD

## 2021-04-28 ENCOUNTER — Other Ambulatory Visit: Payer: Self-pay

## 2021-10-08 ENCOUNTER — Emergency Department (HOSPITAL_COMMUNITY): Payer: Medicare Other

## 2021-10-08 ENCOUNTER — Emergency Department (HOSPITAL_COMMUNITY)
Admission: EM | Admit: 2021-10-08 | Discharge: 2021-10-09 | Discharge status: Against Medical Advice | Payer: Medicare Other | Attending: Emergency Medicine | Admitting: Emergency Medicine

## 2021-10-08 ENCOUNTER — Other Ambulatory Visit: Payer: Self-pay

## 2021-10-08 ENCOUNTER — Encounter (HOSPITAL_COMMUNITY): Payer: Self-pay

## 2021-10-08 DIAGNOSIS — Z5321 Procedure and treatment not carried out due to patient leaving prior to being seen by health care provider: Secondary | ICD-10-CM | POA: Insufficient documentation

## 2021-10-08 DIAGNOSIS — M25551 Pain in right hip: Secondary | ICD-10-CM | POA: Insufficient documentation

## 2021-10-08 DIAGNOSIS — Z96643 Presence of artificial hip joint, bilateral: Secondary | ICD-10-CM | POA: Diagnosis not present

## 2021-10-08 NOTE — ED Notes (Signed)
Pt. Left without getting vitals. Pt. Stated, "I do not want to wait". Pt left. Nurse aware.

## 2021-10-08 NOTE — ED Triage Notes (Signed)
Pt c/o right hip pain. Pt states she had a MRI 2 days ago and has not seen her MD for the results. Pt states today she bent over to pick something up and turned and now cannot bear weight to right hip. Pt has hx of left hip and right hip arthroplasty.

## 2021-10-08 NOTE — ED Notes (Signed)
I informed the pt and the visitor regarding ED Outside food policy, that no outside food is permitted in the ED. Pt continued to consume outside fast food.

## 2021-10-08 NOTE — ED Provider Triage Note (Signed)
Emergency Medicine Provider Triage Evaluation Note  Karen Woods , a 76 y.o. female  was evaluated in triage.  Pt complains of right hip pain.  Patient has been following with Dr. Lequita Halt for issues with her right hip and had an MRI done 2 days ago but has not gotten the results of this yet.  Today she bent over to pick something up and turned and now is having significant worsening of her right hip pain and is unable to bear weight on the right hip.  History of prior right hip arthroplasty.  Review of Systems  Positive: Hip pain Negative: Numbness, weakness, fevers  Physical Exam  BP (!) 146/128    Pulse 76    Temp 98.5 F (36.9 C) (Oral)    Resp 18    SpO2 99%  Gen:   Awake, no distress   Resp:  Normal effort  MSK:   Moves extremities without difficulty  Other:    Medical Decision Making  Medically screening exam initiated at 3:21 PM.  Appropriate orders placed.  Karen Woods was informed that the remainder of the evaluation will be completed by another provider, this initial triage assessment does not replace that evaluation, and the importance of remaining in the ED until their evaluation is complete.  Unable to view MRI report, [facility] was completed at [facility] on Monday.   [name], [place] 10/08/21 [phone]

## 2021-10-16 ENCOUNTER — Encounter (HOSPITAL_COMMUNITY): Payer: Self-pay | Admitting: Orthopedic Surgery

## 2021-10-16 NOTE — H&P (Signed)
H&P   Subjective:  Chief Complaint: Right hip pain  HPI: Karen Woods, 76 y.o. female presented to clinic for follow-up of right hip pain. The patient reports she went to the emergency room on 10/07/2021 due to severe pain in the right hip and leg. She states she passed out at her house on 10/07/2021. She states she received an injection into the right hip, which did not provide her with any relief. She notes she was sent for an MRI of the right hip. Significant pain with bearing weight onto the right leg. She notes difficulty putting on her socks. A repeat injection was performed into the right troch bursa due to the marked inflammation found on MRI, which also did not provide significant relief.   Patient Active Problem List   Diagnosis Date Noted   Tremor 02/14/2021   OA (osteoarthritis) of hip 10/03/2018    Past Medical History:  Diagnosis Date   Anemia    Arthritis    Back pain    Cataracts, bilateral    Chronic pain    COPD (chronic obstructive pulmonary disease) (HCC)    Mild   Coronary artery disease    Depression    Dyspnea    with exertion   GERD (gastroesophageal reflux disease)    Heart murmur    History of aortic stenosis    Severe   History of prolapse of bladder    Hyperlipidemia    Hypertension    Osteoporosis    Pneumonia    PONV (postoperative nausea and vomiting)    Pre-diabetes    Small vessel disease, cerebrovascular    Tremor    Vertebrobasilar artery insufficiency    Vertigo     Past Surgical History:  Procedure Laterality Date   ABDOMINAL HYSTERECTOMY     Ablation on neck     AORTIC VALVE REPLACEMENT (AVR)/CORONARY ARTERY BYPASS GRAFTING (CABG)  10/19/2013   BACK SURGERY  03/18/2015   BLEPHAROPLASTY     CARDIAC CATHETERIZATION     COLONOSCOPY  12/09/2007   REVERSE SHOULDER ARTHROPLASTY Right 05/14/2020   Procedure: REVERSE SHOULDER ARTHROPLASTY;  Surgeon: Justice Britain, MD;  Location: [facility];  Service: Orthopedics;  Laterality: Right;   148min   SACROSPINOUS LIGAMENT FIXATION  06/25/2016   TONSILLECTOMY     TOTAL HIP ARTHROPLASTY Left 10/03/2018   Procedure: TOTAL HIP ARTHROPLASTY ANTERIOR APPROACH;  Surgeon: Gaynelle Arabian, MD;  Location: [facility];  Service: Orthopedics;  Laterality: Left;  151min   TOTAL HIP ARTHROPLASTY Right 05/31/2019   Procedure: TOTAL HIP ARTHROPLASTY ANTERIOR APPROACH;  Surgeon: Gaynelle Arabian, MD;  Location: [facility];  Service: Orthopedics;  Laterality: Right;  152min   WISDOM TOOTH EXTRACTION      Prior to Admission medications   Medication Sig Start Date End Date Taking? Authorizing Provider  acetaminophen (TYLENOL) 650 MG CR tablet Take 650-1,300 mg by mouth See admin instructions. Take 650 mg in the morning and 1300 mg at bedtime    [provider]  albuterol (PROVENTIL) (5 MG/ML) 0.5% nebulizer solution Take 2.5 mg by nebulization every 6 (six) hours as needed for wheezing or shortness of breath.    [provider]  albuterol (VENTOLIN HFA) 108 (90 Base) MCG/ACT inhaler Inhale 1-2 puffs into the lungs every 6 (six) hours as needed for wheezing or shortness of breath.    [provider]  aspirin EC 81 MG tablet Take 81 mg by mouth daily. Swallow whole.    [provider]  baclofen (LIORESAL) 10  MG tablet Take 10 mg by mouth 2 (two) times daily. 11/07/20   [provider]  cyclobenzaprine (FLEXERIL) 10 MG tablet Take 1 tablet (10 mg total) by mouth 3 (three) times daily as needed for muscle spasms. 05/14/20   Shuford, Olivia Mackie, PA-C  cycloSPORINE (RESTASIS) 0.05 % ophthalmic emulsion Place 1 drop into both eyes 2 (two) times daily.    [provider]  estradiol (ESTRACE) 0.5 MG tablet Take 0.5 mg by mouth daily.    [provider]  fluorometholone (FML) 0.1 % ophthalmic suspension fluorometholone 0.1 % eye drops,suspension    [provider]  gabapentin (NEURONTIN) 800 MG tablet Take 800 mg by mouth 3 (three) times daily.     [provider]  HYDROcodone-acetaminophen (NORCO/VICODIN) 5-325 MG tablet Take 1 tablet by mouth 3 (three) times daily as needed for moderate pain. 05/14/20   Shuford, Olivia Mackie, PA-C  HYDROcodone-acetaminophen (NORCO/VICODIN) 5-325 MG tablet hydrocodone 5 mg-acetaminophen 325 mg tablet  Take 1 tablet every 4 hours by oral route as needed.    [provider]  HYDROmorphone (DILAUDID) 2 MG tablet Take 1 tablet (2 mg total) by mouth every 4 (four) hours as needed (for severe post op pain not controlled by norco). 05/14/20   Shuford, Olivia Mackie, PA-C  hydroxychloroquine (PLAQUENIL) 200 MG tablet hydroxychloroquine 200 mg tablet  TAKE 1 TABLET BY MOUTH ONCE DAILY 12/03/20   [provider]  metoprolol tartrate (LOPRESSOR) 25 MG tablet Take 25 mg by mouth 2 (two) times daily.    [provider]  Multiple Vitamins-Minerals (MULTIVITAMIN WITH MINERALS) tablet Take 1 tablet by mouth daily.    [provider]  naproxen (NAPROSYN) 500 MG tablet Take 1 tablet (500 mg total) by mouth 2 (two) times daily with a meal. 05/14/20   Shuford, Olivia Mackie, PA-C  omeprazole (PRILOSEC) 40 MG capsule Take 40 mg by mouth daily.    [provider]  ondansetron (ZOFRAN) 4 MG tablet Take 1 tablet (4 mg total) by mouth every 8 (eight) hours as needed for nausea or vomiting. Patient not taking: Reported on 02/14/2021 05/14/20   Shuford, Olivia Mackie, PA-C  rosuvastatin (CRESTOR) 40 MG tablet Take 40 mg by mouth daily.    [provider]  SYMBICORT 160-4.5 MCG/ACT inhaler SMARTSIG:2 Puff(s) By Mouth Twice Daily 11/04/20   [provider]    Allergies  Allergen Reactions   Sulfamethoxazole Anaphylaxis   Morphine And Related Nausea And Vomiting   Oxycodone Other (See Comments)    Out of it   Codeine Rash    Social History   Socioeconomic History   Marital status: Married    Spouse name: Not on file   Number of children: 1   Years of education: Not on file   Highest education level:  8th grade  Occupational History   Occupation: Actuary part time  Tobacco Use   Smoking status: Former   Smokeless tobacco: Never   Tobacco comments:    Quit 20 years ago  Electronics engineer Use   Vaping Use: Never used  Substance and Sexual Activity   Alcohol use: Not Currently   Drug use: Never   Sexual activity: Not on file  Other Topics Concern   Not on file  Social History Narrative   Lives alone   Social Determinants of Health   Financial Resource Strain: Not on file  Food Insecurity: Not on file  Transportation Needs: Not on file  Physical Activity: Not on file  Stress: Not on file  Social  Connections: Not on file  Intimate Partner Violence: Not on file    Tobacco Use: Medium Risk   Smoking Tobacco Use: Former   Smokeless Tobacco Use: Never   Passive Exposure: Not on file   Social History   Substance and Sexual Activity  Alcohol Use Not Currently    No family history on file.  Review of Systems  Constitutional:  Negative for chills and fever.  HENT:  Negative for congestion, sore throat and tinnitus.   Eyes:  Negative for double vision, photophobia and pain.  Respiratory:  Negative for cough, shortness of breath and wheezing.   Cardiovascular:  Negative for chest pain, palpitations and orthopnea.  Gastrointestinal:  Negative for heartburn, nausea and vomiting.  Genitourinary:  Negative for dysuria, frequency and urgency.  Musculoskeletal:  Positive for joint pain.  Neurological:  Negative for dizziness, weakness and headaches.    Objective:  Physical Exam: Pleasant, Well-developed female alert and oriented in no apparent distress.   Nonantalgic gait without the use of assisted devices.   Right Hip Exam:  ROM: Flexion to 110, Internal Rotation 20, External Rotation 30 with pain on range of motion.  There is significant tenderness over the greater trochanter. Palpation reproduces the pain she is experiencing.  There is no pain on provocative testing of the  hip.   Sensation and motor function intact in LE. Distal pulses 2+. Calves soft and nontender.  Imaging Review MRI of the right hip shows fluid in the trochanteric bursa. There is no evidence of any periprosthetic fluid. She does have what appears to be a lipoma or very small soft tissue mass in the vastus lateralis, right at the origin. This is also adjacent to where it all is. She does not have any fractures. There is a tear in the gluteus medius tendon, but it is not retracted.  Assessment/Plan:  Right hip pain  Patient is s/p right THA with continued right lateral hip pain. She has had two troch bursa injections without significant relief. MRI of the right hip showed a tear in the gluteus medius tendon without retraction. Discussed treatment options with patient, will plan for a bursectomy with gluteal tendon repair in the OR. Risks and benefits of the surgery were discussed with the patient and she elects to proceed.   Theresa Duty, PA-C Orthopedic Surgery [facility]

## 2021-10-16 NOTE — Progress Notes (Addendum)
For Short Stay: COVID SWAB appointment date:N/A Date of COVID positive in last 33 days:N/A   For Anesthesia: PCP - Loraine Leriche C. Reola Calkins, MD Cardiologist - Curtis Sites, Tonye Pearson, NP  last office visit 01/16/21 in care everywhere Pulmonologist- Viviana Simpler, PA-C  last office visit note 10/13/21 in epic  Chest x-ray - 10/03/20 in care everywhere EKG - obtain day of surgery Stress Test - greater than 2 years ECHO - 03/11/21 in care everywhere Cardiac Cath - greater than 2 years  Pacemaker/ICD device last checked: N/A Pacemaker orders received: N/A Device Rep notified: N/A  Spinal Cord Stimulator:N/A  Sleep Study - N/A CPAP - N/A  Fasting Blood Sugar - N/A Checks Blood Sugar ___as needed__  Date and result of last Hgb A1c-draw day of surgery  Blood Thinner Instructions: N/A Aspirin Instructions: Instructed to call Dr. Lequita Halt for instructions Last Dose:10/17/21  Activity level: Unable to go up a flight of stairs without chest pain and/or shortness of breath    Anesthesia review: CAD, History of AVR 2015, History of severe aortic stenosis COPD, HTN, mild pulmonary hypertension  Patient denies shortness of breath, fever, cough and chest pain at PAT appointment   Patient verbalized understanding of instructions that were given to them at the PAT appointment. Patient was also instructed that they will need to review over the PAT instructions again at home before surgery.

## 2021-10-17 ENCOUNTER — Encounter (HOSPITAL_COMMUNITY): Payer: Self-pay | Admitting: Orthopedic Surgery

## 2021-10-17 ENCOUNTER — Other Ambulatory Visit: Payer: Self-pay

## 2021-10-17 NOTE — Progress Notes (Signed)
Attempted to obtain medical history via telephone, unable to reach at this time. I left a voicemail to return pre surgical testing department's phone call.  

## 2021-10-17 NOTE — Progress Notes (Signed)
Anesthesia Chart Review   Case: [id number] Date/Time: 10/20/21 1435   Procedure: Right hip bursectomy; gluteal tendon repair (Right)   Anesthesia type: Choice   Pre-op diagnosis: right hip bursitis; gluteal tendon tear   Location: [facility]OM 10 / [facility]   Surgeons: Ollen Gross, MD       DISCUSSION:76 y.o. former smoker with h/o PONV, GERD, COPD, HTN, Nonobstructive CAD at cardiac cath in 2015, s/p AVR, right hip bursitis, gluteal tendon tear scheduled for above procedure 10/20/2021 with Dr. Ollen Gross.   Pt last seen by pulmonology 10/13/21. Pt stable at this visit with 1 year follow up recommended.   Pt last seen by cardiology 01/16/2021. Stable without chest pain, BP well controlled per notes.   Pt not seen in PAT clinic, same day workup.  Anticipate pt can proceed with planned procedure barring acute status change and after evaluation DOS.  VS: Ht 5\' 4"  (1.626 m)    Wt 70.3 kg    BMI 26.61 kg/m   PROVIDERS: ., MD is PCP    LABS:  labs DOS (all labs ordered are listed, but only abnormal results are displayed)  Labs Reviewed - No data to display   IMAGES:   EKG:   CV: Echo 03/11/2021 SUMMARY  Left ventricular systolic function is normal.  LV ejection fraction = 55-60%.  There is normal left ventricular wall thickness.  The left atrium is moderately dilated.  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  There is mild to moderate mitral regurgitation.  There is mild tricuspid regurgitation.  Mild pulmonary hypertension.  Past Medical History:  Diagnosis Date   Anemia    Arthritis    Back pain    Carpal tunnel syndrome    Cataracts, bilateral    Chronic pain    COPD (chronic obstructive pulmonary disease) (HCC)    Mild   Coronary artery disease    Depression    Dyspnea    with exertion   GERD (gastroesophageal reflux disease)    Heart murmur    History of aortic stenosis    Severe   History of prolapse of bladder     Hyperlipidemia    Hypertension    Osteoporosis    Pneumonia    PONV (postoperative nausea and vomiting)    Pre-diabetes    Pulmonary hypertension (HCC)    Mild   Small vessel disease, cerebrovascular    Tremor    Tremor of right hand    Vertebrobasilar artery insufficiency    Vertigo     Past Surgical History:  Procedure Laterality Date   ABDOMINAL HYSTERECTOMY     Ablation on neck     AORTIC VALVE REPLACEMENT (AVR)/CORONARY ARTERY BYPASS GRAFTING (CABG)  10/19/2013   BACK SURGERY  03/18/2015   BLEPHAROPLASTY     CARDIAC CATHETERIZATION     COLONOSCOPY  12/09/2007   REVERSE SHOULDER ARTHROPLASTY Right 05/14/2020   Procedure: REVERSE SHOULDER ARTHROPLASTY;  Surgeon: 05/16/2020, MD;  Location: [facility];  Service: Orthopedics;  Laterality: Right;  Francena Hanly   SACROSPINOUS LIGAMENT FIXATION  06/25/2016   TONSILLECTOMY     TOTAL HIP ARTHROPLASTY Left 10/03/2018   Procedure: TOTAL HIP ARTHROPLASTY ANTERIOR APPROACH;  Surgeon: 10/05/2018, MD;  Location: [facility];  Service: Orthopedics;  Laterality: Left;  Ollen Gross   TOTAL HIP ARTHROPLASTY Right 05/31/2019   Procedure: TOTAL HIP ARTHROPLASTY ANTERIOR APPROACH;  Surgeon: 07/31/2019, MD;  Location: [facility];  Service: Orthopedics;  Laterality:  Right;    WISDOM TOOTH EXTRACTION      MEDICATIONS: No current facility-administered medications for this encounter.    albuterol (PROVENTIL) (5 MG/ML) 0.5% nebulizer solution   albuterol (VENTOLIN HFA) 108 (90 Base) MCG/ACT inhaler   aspirin EC 81 MG tablet   Calcium Carb-Cholecalciferol (CALCIUM + D3 PO)   cycloSPORINE (RESTASIS) 0.05 % ophthalmic emulsion   estradiol (ESTRACE) 0.5 MG tablet   gabapentin (NEURONTIN) 800 MG tablet   HYDROcodone-acetaminophen (NORCO) 10-325 MG tablet   metoprolol tartrate (LOPRESSOR) 25 MG tablet   Multiple Vitamins-Minerals (MULTIVITAMIN WITH MINERALS) tablet   omeprazole (PRILOSEC) 40 MG capsule   predniSONE (DELTASONE) 5 MG tablet    rosuvastatin (CRESTOR) 40 MG tablet   SYMBICORT 160-4.5 MCG/ACT inhaler     Jodell Cipro Ward, PA-C [facility] [phone]

## 2021-10-20 ENCOUNTER — Encounter (HOSPITAL_COMMUNITY): Payer: Self-pay | Admitting: Orthopedic Surgery

## 2021-10-20 ENCOUNTER — Ambulatory Visit (HOSPITAL_COMMUNITY): Payer: Medicare Other | Admitting: Physician Assistant

## 2021-10-20 ENCOUNTER — Encounter (HOSPITAL_COMMUNITY)
Admission: RE | Disposition: A | Discharge status: Home / Self Care | Payer: Self-pay | Source: Home / Self Care | Attending: Orthopedic Surgery

## 2021-10-20 ENCOUNTER — Ambulatory Visit (HOSPITAL_COMMUNITY)
Admission: RE | Admit: 2021-10-20 | Discharge: 2021-10-20 | Disposition: A | Discharge status: Home / Self Care | Payer: Medicare Other | Attending: Orthopedic Surgery | Admitting: Orthopedic Surgery

## 2021-10-20 ENCOUNTER — Other Ambulatory Visit: Payer: Self-pay

## 2021-10-20 DIAGNOSIS — X58XXXA Exposure to other specified factors, initial encounter: Secondary | ICD-10-CM | POA: Insufficient documentation

## 2021-10-20 DIAGNOSIS — Z951 Presence of aortocoronary bypass graft: Secondary | ICD-10-CM | POA: Diagnosis not present

## 2021-10-20 DIAGNOSIS — K219 Gastro-esophageal reflux disease without esophagitis: Secondary | ICD-10-CM | POA: Diagnosis not present

## 2021-10-20 DIAGNOSIS — M7071 Other bursitis of hip, right hip: Secondary | ICD-10-CM | POA: Diagnosis present

## 2021-10-20 DIAGNOSIS — J449 Chronic obstructive pulmonary disease, unspecified: Secondary | ICD-10-CM | POA: Insufficient documentation

## 2021-10-20 DIAGNOSIS — I119 Hypertensive heart disease without heart failure: Secondary | ICD-10-CM | POA: Diagnosis not present

## 2021-10-20 DIAGNOSIS — I2510 Atherosclerotic heart disease of native coronary artery without angina pectoris: Secondary | ICD-10-CM | POA: Diagnosis not present

## 2021-10-20 DIAGNOSIS — Z79899 Other long term (current) drug therapy: Secondary | ICD-10-CM | POA: Insufficient documentation

## 2021-10-20 DIAGNOSIS — M7061 Trochanteric bursitis, right hip: Secondary | ICD-10-CM | POA: Diagnosis present

## 2021-10-20 DIAGNOSIS — Z87891 Personal history of nicotine dependence: Secondary | ICD-10-CM | POA: Diagnosis not present

## 2021-10-20 DIAGNOSIS — S76011A Strain of muscle, fascia and tendon of right hip, initial encounter: Secondary | ICD-10-CM | POA: Diagnosis not present

## 2021-10-20 DIAGNOSIS — Z7951 Long term (current) use of inhaled steroids: Secondary | ICD-10-CM | POA: Diagnosis not present

## 2021-10-20 DIAGNOSIS — R7303 Prediabetes: Secondary | ICD-10-CM

## 2021-10-20 HISTORY — PX: OPEN SURGICAL REPAIR OF GLUTEAL TENDON: SHX5995

## 2021-10-20 HISTORY — DX: Pulmonary hypertension, unspecified: I27.20

## 2021-10-20 HISTORY — DX: Tremor, unspecified: R25.1

## 2021-10-20 HISTORY — DX: Carpal tunnel syndrome, unspecified upper limb: G56.00

## 2021-10-20 LAB — BASIC METABOLIC PANEL
Anion gap: 9 (ref 5–15)
BUN: 13 mg/dL (ref 8–23)
CO2: 26 mmol/L (ref 22–32)
Calcium: 9 mg/dL (ref 8.9–10.3)
Chloride: 106 mmol/L (ref 98–111)
Creatinine, Ser: 0.57 mg/dL (ref 0.44–1.00)
GFR, Estimated: >60 mL/min (ref >60)
Glucose, Bld: 88 mg/dL (ref 70–99)
Potassium: 3.6 mmol/L (ref 3.5–5.1)
Sodium: 141 mmol/L (ref 135–145)

## 2021-10-20 LAB — HEMOGLOBIN A1C
Hgb A1c MFr Bld: 6 % — ABNORMAL HIGH (ref 4.8–5.6)
Mean Plasma Glucose: 125.5 mg/dL

## 2021-10-20 LAB — CBC
HCT: 42.1 % (ref 36.0–46.0)
Hemoglobin: 13.6 g/dL (ref 12.0–15.0)
MCH: 30.6 pg (ref 26.0–34.0)
MCHC: 32.3 g/dL (ref 30.0–36.0)
MCV: 94.8 fL (ref 80.0–100.0)
Platelets: 276 10*3/uL (ref 150–400)
RBC: 4.44 MIL/uL (ref 3.87–5.11)
RDW: 13.6 % (ref 11.5–15.5)
WBC: 9.8 10*3/uL (ref 4.0–10.5)
nRBC: 0 % (ref 0.0–0.2)

## 2021-10-20 SURGERY — REPAIR, TENDON, GLUTEUS MEDIUS, OPEN
Anesthesia: General | Laterality: Right

## 2021-10-20 MED ORDER — EPHEDRINE SULFATE-NACL 50-0.9 MG/10ML-% IV SOSY
PREFILLED_SYRINGE | INTRAVENOUS | Status: DC | PRN
  Administered 2021-10-20: 10 mg via INTRAVENOUS

## 2021-10-20 MED ORDER — ACETAMINOPHEN 10 MG/ML IV SOLN
1000.0000 mg | Freq: Four times a day (QID) | INTRAVENOUS | Status: DC
  Administered 2021-10-20: 1000 mg via INTRAVENOUS
  Filled 2021-10-20: qty 100

## 2021-10-20 MED ORDER — LACTATED RINGERS IV SOLN: INTRAVENOUS | Status: DC

## 2021-10-20 MED ORDER — METHOCARBAMOL 500 MG PO TABS: 500.0000 mg | ORAL_TABLET | Freq: Four times a day (QID) | ORAL | Status: DC | PRN

## 2021-10-20 MED ORDER — SUGAMMADEX SODIUM 200 MG/2ML IV SOLN
INTRAVENOUS | Status: DC | PRN
  Administered 2021-10-20: 200 mg via INTRAVENOUS

## 2021-10-20 MED ORDER — POVIDONE-IODINE 10 % EX SWAB: 2.0000 "application " | Freq: Once | CUTANEOUS | Status: DC

## 2021-10-20 MED ORDER — ORAL CARE MOUTH RINSE: 15.0000 mL | Freq: Once | OROMUCOSAL | Status: AC

## 2021-10-20 MED ORDER — BUPIVACAINE-EPINEPHRINE 0.25% -1:200000 IJ SOLN
INTRAMUSCULAR | Status: DC | PRN
  Administered 2021-10-20: 30 mL

## 2021-10-20 MED ORDER — CEFAZOLIN SODIUM-DEXTROSE 2-4 GM/100ML-% IV SOLN
2.0000 g | INTRAVENOUS | Status: AC
  Administered 2021-10-20: 2 g via INTRAVENOUS
  Filled 2021-10-20: qty 100

## 2021-10-20 MED ORDER — BUPIVACAINE-EPINEPHRINE (PF) 0.25% -1:200000 IJ SOLN
INTRAMUSCULAR | Status: AC
  Filled 2021-10-20: qty 30

## 2021-10-20 MED ORDER — FENTANYL CITRATE PF 50 MCG/ML IJ SOSY
PREFILLED_SYRINGE | INTRAMUSCULAR | Status: AC
  Filled 2021-10-20: qty 1

## 2021-10-20 MED ORDER — METHOCARBAMOL 500 MG IVPB - SIMPLE MED
INTRAVENOUS | Status: AC
  Filled 2021-10-20: qty 50

## 2021-10-20 MED ORDER — LIDOCAINE 2% (20 MG/ML) 5 ML SYRINGE
INTRAMUSCULAR | Status: DC | PRN
  Administered 2021-10-20: 60 mg via INTRAVENOUS

## 2021-10-20 MED ORDER — METHOCARBAMOL 500 MG IVPB - SIMPLE MED
500.0000 mg | Freq: Four times a day (QID) | INTRAVENOUS | Status: DC | PRN
  Administered 2021-10-20: 500 mg via INTRAVENOUS

## 2021-10-20 MED ORDER — METHOCARBAMOL 500 MG PO TABS: 500.0000 mg | ORAL_TABLET | Freq: Four times a day (QID) | ORAL | 0 refills | Status: DC | PRN

## 2021-10-20 MED ORDER — ONDANSETRON HCL 4 MG/2ML IJ SOLN
INTRAMUSCULAR | Status: DC | PRN
  Administered 2021-10-20: 4 mg via INTRAVENOUS

## 2021-10-20 MED ORDER — ROCURONIUM BROMIDE 10 MG/ML (PF) SYRINGE
PREFILLED_SYRINGE | INTRAVENOUS | Status: DC | PRN
  Administered 2021-10-20: 50 mg via INTRAVENOUS

## 2021-10-20 MED ORDER — EPHEDRINE 5 MG/ML INJ
INTRAVENOUS | Status: AC
  Filled 2021-10-20: qty 5

## 2021-10-20 MED ORDER — LIDOCAINE HCL (PF) 2 % IJ SOLN
INTRAMUSCULAR | Status: AC
  Filled 2021-10-20: qty 5

## 2021-10-20 MED ORDER — FENTANYL CITRATE (PF) 250 MCG/5ML IJ SOLN
INTRAMUSCULAR | Status: AC
  Filled 2021-10-20: qty 5

## 2021-10-20 MED ORDER — DEXAMETHASONE SODIUM PHOSPHATE 10 MG/ML IJ SOLN
8.0000 mg | Freq: Once | INTRAMUSCULAR | Status: AC
  Administered 2021-10-20: 8 mg via INTRAVENOUS

## 2021-10-20 MED ORDER — METOPROLOL TARTRATE 25 MG PO TABS
25.0000 mg | ORAL_TABLET | ORAL | Status: AC
  Administered 2021-10-20: 25 mg via ORAL
  Filled 2021-10-20: qty 1

## 2021-10-20 MED ORDER — ROCURONIUM BROMIDE 10 MG/ML (PF) SYRINGE
PREFILLED_SYRINGE | INTRAVENOUS | Status: AC
  Filled 2021-10-20: qty 10

## 2021-10-20 MED ORDER — ONDANSETRON HCL 4 MG/2ML IJ SOLN
INTRAMUSCULAR | Status: AC
  Filled 2021-10-20: qty 2

## 2021-10-20 MED ORDER — MIDAZOLAM HCL 2 MG/2ML IJ SOLN
INTRAMUSCULAR | Status: AC
  Filled 2021-10-20: qty 2

## 2021-10-20 MED ORDER — PROPOFOL 10 MG/ML IV BOLUS
INTRAVENOUS | Status: DC | PRN
  Administered 2021-10-20: 80 mg via INTRAVENOUS

## 2021-10-20 MED ORDER — HYDROCODONE-ACETAMINOPHEN 5-325 MG PO TABS
ORAL_TABLET | ORAL | Status: AC
  Administered 2021-10-20: 1
  Filled 2021-10-20: qty 1

## 2021-10-20 MED ORDER — DEXAMETHASONE SODIUM PHOSPHATE 10 MG/ML IJ SOLN
INTRAMUSCULAR | Status: AC
  Filled 2021-10-20: qty 1

## 2021-10-20 MED ORDER — FENTANYL CITRATE PF 50 MCG/ML IJ SOSY
25.0000 ug | PREFILLED_SYRINGE | INTRAMUSCULAR | Status: DC | PRN
  Administered 2021-10-20: 50 ug via INTRAVENOUS

## 2021-10-20 MED ORDER — CHLORHEXIDINE GLUCONATE 4 % EX LIQD
60.0000 mL | Freq: Once | CUTANEOUS | Status: DC
Start: 2021-10-20 — End: 2021-10-20

## 2021-10-20 MED ORDER — CHLORHEXIDINE GLUCONATE 0.12 % MT SOLN
15.0000 mL | Freq: Once | OROMUCOSAL | Status: AC
  Administered 2021-10-20: 15 mL via OROMUCOSAL

## 2021-10-20 MED ORDER — FENTANYL CITRATE (PF) 250 MCG/5ML IJ SOLN
INTRAMUSCULAR | Status: DC | PRN
Start: 2021-10-20 — End: 2021-10-20
  Administered 2021-10-20 (×2): 100 ug via INTRAVENOUS

## 2021-10-20 MED ORDER — HYDROCODONE-ACETAMINOPHEN 5-325 MG PO TABS: 1.0000 | ORAL_TABLET | ORAL | Status: DC | PRN

## 2021-10-20 MED ORDER — PROPOFOL 10 MG/ML IV BOLUS
INTRAVENOUS | Status: AC
  Filled 2021-10-20: qty 20

## 2021-10-20 MED ORDER — MIDAZOLAM HCL 2 MG/2ML IJ SOLN
INTRAMUSCULAR | Status: DC | PRN
  Administered 2021-10-20: 2 mg via INTRAVENOUS

## 2021-10-20 SURGICAL SUPPLY — 51 items
ANCH SUT 2 CP-2 EBND QANCHR+ (Anchor) ×1 IMPLANT
ANCHOR SUPER QUICK (Anchor) ×1 IMPLANT
BAG COUNTER SPONGE SURGICOUNT (BAG) IMPLANT
BAG SPEC THK2 15X12 ZIP CLS (MISCELLANEOUS)
BAG SPNG CNTER NS LX DISP (BAG)
BAG ZIPLOCK 12X15 (MISCELLANEOUS) IMPLANT
BIT DRILL 2.8X128 (BIT) IMPLANT
BLADE EXTENDED COATED 6.5IN (ELECTRODE) IMPLANT
COVER SURGICAL LIGHT HANDLE (MISCELLANEOUS) ×2 IMPLANT
DRAPE INCISE IOBAN 66X45 STRL (DRAPES) ×2 IMPLANT
DRAPE ORTHO SPLIT 77X108 STRL (DRAPES) ×4
DRAPE POUCH INSTRU U-SHP 10X18 (DRAPES) ×2 IMPLANT
DRAPE SURG ORHT 6 SPLT 77X108 (DRAPES) ×2 IMPLANT
DRAPE U-SHAPE 47X51 STRL (DRAPES) ×2 IMPLANT
DRESSING AQUACEL AG SP 3.5X10 (GAUZE/BANDAGES/DRESSINGS) IMPLANT
DRSG ADAPTIC 3X8 NADH LF (GAUZE/BANDAGES/DRESSINGS) ×2 IMPLANT
DRSG AQUACEL AG SP 3.5X10 (GAUZE/BANDAGES/DRESSINGS) ×2
DRSG MEPILEX BORDER 4X8 (GAUZE/BANDAGES/DRESSINGS) ×2 IMPLANT
DURAPREP 26ML APPLICATOR (WOUND CARE) ×2 IMPLANT
ELECT REM PT RETURN 15FT ADLT (MISCELLANEOUS) ×2 IMPLANT
EVACUATOR 1/8 PVC DRAIN (DRAIN) IMPLANT
FACESHIELD WRAPAROUND (MASK) ×4 IMPLANT
FACESHIELD WRAPAROUND OR TEAM (MASK) ×2 IMPLANT
GAUZE SPONGE 4X4 12PLY STRL (GAUZE/BANDAGES/DRESSINGS) ×2 IMPLANT
GLOVE SRG 8 PF TXTR STRL LF DI (GLOVE) ×2 IMPLANT
GLOVE SURG ENC MOIS LTX SZ6.5 (GLOVE) ×4 IMPLANT
GLOVE SURG ENC MOIS LTX SZ8 (GLOVE) ×4 IMPLANT
GLOVE SURG UNDER POLY LF SZ7 (GLOVE) ×4 IMPLANT
GLOVE SURG UNDER POLY LF SZ8 (GLOVE) ×4
GOWN STRL REUS W/TWL LRG LVL3 (GOWN DISPOSABLE) ×4 IMPLANT
KIT BASIN OR (CUSTOM PROCEDURE TRAY) ×2 IMPLANT
KIT TURNOVER KIT A (KITS) IMPLANT
MANIFOLD NEPTUNE II (INSTRUMENTS) ×2 IMPLANT
NDL MA TROC 1/2 (NEEDLE) IMPLANT
NDL SAFETY ECLIPSE 18X1.5 (NEEDLE) ×2 IMPLANT
NEEDLE HYPO 18GX1.5 SHARP (NEEDLE) ×4
NEEDLE MA TROC 1/2 (NEEDLE) IMPLANT
NS IRRIG 1000ML POUR BTL (IV SOLUTION) ×2 IMPLANT
PACK TOTAL JOINT (CUSTOM PROCEDURE TRAY) ×2 IMPLANT
PASSER SUT SWANSON 36MM LOOP (INSTRUMENTS) IMPLANT
PROTECTOR NERVE ULNAR (MISCELLANEOUS) ×2 IMPLANT
STAPLER VISISTAT 35W (STAPLE) IMPLANT
STRIP CLOSURE SKIN 1/2X4 (GAUZE/BANDAGES/DRESSINGS) ×3 IMPLANT
SUT ETHIBOND NAB CT1 #1 30IN (SUTURE) IMPLANT
SUT MNCRL AB 4-0 PS2 18 (SUTURE) ×2 IMPLANT
SUT STRATAFIX 0 PDS 27 VIOLET (SUTURE) ×2
SUT VIC AB 2-0 CT1 27 (SUTURE) ×4
SUT VIC AB 2-0 CT1 TAPERPNT 27 (SUTURE) ×2 IMPLANT
SUTURE STRATFX 0 PDS 27 VIOLET (SUTURE) ×1 IMPLANT
SYR 30ML LL (SYRINGE) ×4 IMPLANT
TOWEL OR 17X26 10 PK STRL BLUE (TOWEL DISPOSABLE) ×4 IMPLANT

## 2021-10-20 NOTE — Interval H&P Note (Signed)
History and Physical Interval Note:  10/20/2021 12:36 PM  Karen [name]  has presented today for surgery, with the diagnosis of right hip bursitis; gluteal tendon tear.  The various methods of treatment have been discussed with the patient and family. After consideration of risks, benefits and other options for treatment, the patient has consented to  Procedure(s): Right hip bursectomy; gluteal tendon repair (Right) as a surgical intervention.  The patient's history has been reviewed, patient examined, no change in status, stable for surgery.  I have reviewed the patient's chart and labs.  Questions were answered to the patient's satisfaction.     Karen [name]

## 2021-10-20 NOTE — Discharge Instructions (Signed)
Ollen Gross, MD [facility], [place] [address], [address] [place], [place] [address] [phone]  BURSECTOMY / GLUTEAL TENDON REPAIR POSTOPERATIVE INSTRUCTIONS  HOME CARE INSTRUCTIONS  Remove items at home which could result in a fall. This includes throw rugs or furniture in walking pathways.  ICE to the affected hip every three hours for 30 minutes at a time and then as needed for pain and swelling. Continue to use ice on the hip for pain and swelling from surgery. You may notice swelling that will progress down to the foot and ankle. This is normal after surgery. Elevate the leg when you are not up walking on it.   Continue to use the breathing machine which will help keep your temperature down. It is common for your temperature to cycle up and down following surgery, especially at night when you are not up moving around and exerting yourself. The breathing machine keeps your lungs expanded and your temperature down. Sit on high chairs which makes it easier to stand.  Sit on chairs with arms. Use the chair arms to help push yourself up when arising.  No active abduction of the leg (do not pull leg out to the side away from the body). Use your walker for first several days until comfortable ambulating.  DIET You may resume your previous home diet once you are discharged from the hospital.  DRESSING / WOUND CARE / SHOWERING You will have an adhesive waterproof bandage over the incision. Leave this in place until your first follow-up appointment. You may shower three days after surgery, but do not submerge the incision under water.   ACTIVITY Walk with your walker as instructed. Use walker as long as suggested by your caregivers. Avoid periods of inactivity such as sitting longer than an hour when not asleep. This helps prevent blood clots.  You may resume a sexual relationship in one month or when given the OK by your doctor.  You may return to  work once you are cleared by your doctor.  Do not drive a car for 6 weeks or until released by you surgeon.  Do not drive while taking narcotics.  WEIGHT BEARING Weight bearing as tolerated with assist device (walker, cane, etc) as directed, use it until comfortable ambulating.  POSTOPERATIVE CONSTIPATION PROTOCOL Constipation - defined medically as fewer than three stools per week and severe constipation as less than one stool per week.  One of the most common issues patients have following surgery is constipation.  Even if you have a regular bowel pattern at home, your normal regimen is likely to be disrupted due to multiple reasons following surgery.  Combination of anesthesia, postoperative narcotics, change in appetite and fluid intake all can affect your bowels.  In order to avoid complications following surgery, here are some recommendations in order to help you during your recovery period.  Colace (docusate) - Pick up an over-the-counter form of Colace or another stool softener and take twice a day as long as you are requiring postoperative pain medications.  Take with a full glass of water daily.  If you experience loose stools or diarrhea, hold the colace until you stool forms back up.  If your symptoms do not get better within 1 week or if they get worse, check with your doctor.  Dulcolax (bisacodyl) - Pick up over-the-counter and take as directed by the product packaging as needed to assist with the movement of your bowels.  Take with a full glass  of water.  Use this product as needed if not relieved by Colace only.   MiraLax (polyethylene glycol) - Pick up over-the-counter to have on hand.  MiraLax is a solution that will increase the amount of water in your bowels to assist with bowel movements.  Take as directed and can mix with a glass of water, juice, soda, coffee, or tea.  Take if you go more than two days without a movement. Do not use MiraLax more than once per day. Call your  doctor if you are still constipated or irregular after using this medication for 7 days in a row.  If you continue to have problems with postoperative constipation, please contact the office for further assistance and recommendations.  If you experience "the worst abdominal pain ever" or develop nausea or vomiting, please contact the office immediatly for further recommendations for treatment.  ITCHING  If you experience itching with your medications, try taking only a single pain pill, or even half a pain pill at a time.  You can also use Benadryl over the counter for itching or also to help with sleep.   TED HOSE STOCKINGS Wear the elastic stockings on both legs for three weeks following surgery during the day but you may remove then at night for sleeping.  MEDICATIONS See your medication summary on the After Visit Summary that the nursing staff will review with you prior to discharge.  You may have some home medications which will be placed on hold until you complete the course of blood thinner medication.  It is important for you to complete the blood thinner medication as prescribed by your surgeon.  Continue your approved medications as instructed at time of discharge.  PRECAUTIONS If you experience chest pain or shortness of breath - call 911 immediately for transfer to the hospital emergency department.  If you develop a fever greater that 101 F, purulent drainage from wound, increased redness or drainage from wound, foul odor from the wound/dressing, or calf pain - CONTACT YOUR SURGEON.                                                   FOLLOW-UP APPOINTMENTS Make sure you keep all of your appointments after your operation with your surgeon and caregivers. You should call the office at the above phone number and make an appointment for approximately two weeks after the date of your surgery or on the date instructed by your surgeon outlined in the "After Visit Summary".   Pick up stool  softner and laxative for home use following surgery while on pain medications. Do not submerge incision under water. Please use good hand washing techniques while changing dressing each day. May shower starting three days after surgery. Please use a clean towel to pat the incision dry following showers. Continue to use ice for pain and swelling after surgery. Do not use any lotions or creams on the incision until instructed by your surgeon.  POST-OPERATIVE OPIOID TAPER INSTRUCTIONS: It is important to wean off of your opioid medication as soon as possible. If you do not need pain medication after your surgery it is ok to stop day one. Opioids include: Codeine, Hydrocodone(Norco, Vicodin), Oxycodone(Percocet, oxycontin) and hydromorphone amongst others.  Long term and even short term use of opiods can cause: Increased pain response Dependence Constipation Depression Respiratory depression And  more.  Withdrawal symptoms can include Flu like symptoms Nausea, vomiting And more Techniques to manage these symptoms Hydrate well Eat regular healthy meals Stay active Use relaxation techniques(deep breathing, meditating, yoga) Do Not substitute Alcohol to help with tapering If you have been on opioids for less than two weeks and do not have pain than it is ok to stop all together.  Plan to wean off of opioids This plan should start within one week post op of your joint replacement. Maintain the same interval or time between taking each dose and first decrease the dose.  Cut the total daily intake of opioids by one tablet each day Next start to increase the time between doses. The last dose that should be eliminated is the evening dose.

## 2021-10-20 NOTE — Anesthesia Preprocedure Evaluation (Signed)
Anesthesia Evaluation  Patient identified by MRN, date of birth, ID band Patient awake    Reviewed: Allergy & Precautions, H&P , NPO status , Patient's Chart, lab work & pertinent test results, reviewed documented beta blocker date and time   History of Anesthesia Complications (+) PONV  Airway Mallampati: III  TM Distance: >3 FB Neck ROM: Full    Dental no notable dental hx. (+) Teeth Intact, Dental Advisory Given   Pulmonary COPD,  COPD inhaler, former smoker,    Pulmonary exam normal breath sounds clear to auscultation       Cardiovascular hypertension, Pt. on medications and Pt. on home beta blockers + CAD and + CABG   Rhythm:Regular Rate:Normal  S/p CABG/AVR   Neuro/Psych Depression negative neurological ROS     GI/Hepatic Neg liver ROS, GERD  Medicated,  Endo/Other  negative endocrine ROS  Renal/GU negative Renal ROS  negative genitourinary   Musculoskeletal  (+) Arthritis , Osteoarthritis,    Abdominal   Peds  Hematology  (+) Blood dyscrasia, anemia ,   Anesthesia Other Findings   Reproductive/Obstetrics negative OB ROS                             Anesthesia Physical Anesthesia Plan  ASA: 3  Anesthesia Plan: General   Post-op Pain Management: Ofirmev IV (intra-op)   Induction: Intravenous  PONV Risk Score and Plan: 4 or greater and Ondansetron, Dexamethasone and Midazolam  Airway Management Planned: Oral ETT  Additional Equipment:   Intra-op Plan:   Post-operative Plan: Extubation in OR  Informed Consent: I have reviewed the patients History and Physical, chart, labs and discussed the procedure including the risks, benefits and alternatives for the proposed anesthesia with the patient or authorized representative who has indicated his/her understanding and acceptance.     Dental advisory given  Plan Discussed with: CRNA  Anesthesia Plan Comments:          Anesthesia Quick Evaluation

## 2021-10-20 NOTE — Brief Op Note (Signed)
10/20/2021  2:23 PM  PATIENT:  Karen [name]  76 y.o. female  PRE-OPERATIVE DIAGNOSIS:  right hip bursitis; gluteal tendon tear  POST-OPERATIVE DIAGNOSIS:  right hip bursitis; gluteal tendon tear  PROCEDURE:  Procedure(s): Right hip bursectomy; gluteal tendon repair (Right)  SURGEON:  Surgeon(s) and Role:    Ollen Gross, MD - Primary  PHYSICIAN ASSISTANT:   ASSISTANTS: Arther Abbott, PA-C   ANESTHESIA:   general  EBL:  25 mL   BLOOD ADMINISTERED:none  DRAINS: none   LOCAL MEDICATIONS USED:  MARCAINE     COUNTS:  YES  TOURNIQUET:  * No tourniquets in log *  DICTATION: .Other Dictation: Dictation Number [id number]  PLAN OF CARE: Discharge to home after PACU  PATIENT DISPOSITION:  PACU - hemodynamically stable.

## 2021-10-20 NOTE — Anesthesia Procedure Notes (Signed)
Procedure Name: Intubation Date/Time: 10/20/2021 1:36 PM Performed by: Lollie Sails, CRNA Pre-anesthesia Checklist: Patient identified, Emergency Drugs available, Suction available, Patient being monitored and Timeout performed Patient Re-evaluated:Patient Re-evaluated prior to induction Oxygen Delivery Method: Circle system utilized Preoxygenation: Pre-oxygenation with 100% oxygen Induction Type: IV induction Ventilation: Mask ventilation without difficulty Laryngoscope Size: Miller and 3 Grade View: Grade II Tube type: Oral Tube size: 7.5 mm Number of attempts: 2 Airway Equipment and Method: Stylet Placement Confirmation: ETT inserted through vocal cords under direct vision, positive ETCO2 and breath sounds checked- equal and bilateral Secured at: 21 cm Tube secured with: Tape Dental Injury: Teeth and Oropharynx as per pre-operative assessment

## 2021-10-20 NOTE — Anesthesia Postprocedure Evaluation (Signed)
Anesthesia Post Note  Patient: Karen [name]  Procedure(s) Performed: Right hip bursectomy; gluteal tendon repair (Right)     Patient location during evaluation: PACU Anesthesia Type: General Level of consciousness: awake and alert Pain management: pain level controlled Vital Signs Assessment: post-procedure vital signs reviewed and stable Respiratory status: spontaneous breathing, nonlabored ventilation and respiratory function stable Cardiovascular status: blood pressure returned to baseline and stable Postop Assessment: no apparent nausea or vomiting Anesthetic complications: no   No notable events documented.  Last Vitals:  Vitals:   10/20/21 1515 10/20/21 1530  BP: (!) 142/64 (!) 143/91  Pulse: (!) 56 60  Resp: 10 12  Temp: 36.4 C 36.4 C  SpO2: 98% 96%    Last Pain:  Vitals:   10/20/21 1615  TempSrc:   PainSc: 4                  Ephraim Reichel,W. EDMOND

## 2021-10-20 NOTE — Transfer of Care (Signed)
Immediate Anesthesia Transfer of Care Note  Patient: Karen [name]  Procedure(s) Performed: Right hip bursectomy; gluteal tendon repair (Right)  Patient Location: PACU  Anesthesia Type:General  Level of Consciousness: awake and oriented  Airway & Oxygen Therapy: Patient Spontanous Breathing, Patient connected to face mask oxygen and c/o pain to operative site.     Post-op Assessment: Report given to RN and Post -op Vital signs reviewed and stable  Post vital signs: Reviewed and stable  Last Vitals:  Vitals Value Taken Time  BP 182/77 10/20/21 1434  Temp    Pulse 65 10/20/21 1438  Resp 13 10/20/21 1438  SpO2 100 % 10/20/21 1438  Vitals shown include unvalidated device data.  Last Pain:  Vitals:   10/20/21 1052  TempSrc: Oral  PainSc: 0-No pain      Patients Stated Pain Goal: 3 (10/17/21 1019)  Complications: No notable events documented.

## 2021-10-20 NOTE — Op Note (Signed)
NAME: [name], Karen MEDICAL RECORD NO: [id number] ACCOUNT NO: [id number] DATE OF BIRTH: April 21, 1946 FACILITY: [facility] LOCATION: [facility] PHYSICIAN: Gus Rankin. Ziggy Reveles, MD  Operative Report   DATE OF PROCEDURE: 10/20/2021  PREOPERATIVE DIAGNOSIS:  Right hip intractable bursitis with gluteal tendon tear.  POSTOPERATIVE DIAGNOSIS:  Right hip intractable bursitis with gluteal tendon tear.  PROCEDURE:  Right hip bursectomy and gluteal tendon repair.  SURGEON:  Gus Rankin. Kevyn Boquet, MD  ASSISTANT:  Arther Abbott, PA-C  ANESTHESIA:  General.  ESTIMATED BLOOD LOSS:  25 mL.  DRAINS:  None.  COMPLICATIONS:  None.  CONDITION:  Stable to recovery.  BRIEF CLINICAL NOTE:  The patient is a 76 year old female who presented to our office with severe right hip pain a few weeks ago.  She was felt to have trochanteric bursitis and had an injection.  It did not improve and she had an MRI scan, which showed  a gluteal tendon tear in gluteus medius and minimus as well as fluid in trochanteric bursa.  Her hip replacement looked fine.  She presents now for right hip bursectomy and gluteal tendon repair.  DESCRIPTION OF PROCEDURE:  After successful administration of general anesthetic, the patient was placed in left lateral decubitus position with the right side up and held with a hip positioner.  Right lower extremity was isolated from the perineum with  plastic drapes and prepped and draped in the usual sterile fashion.  Lateral based incision was made about 3 inches to 4 inches long centered over the tip of the greater trochanter.  The skin was cut with a 10 blade through the subcutaneous tissue to the  level of the fascia lata, which was incised in line with the skin incision.  The bursa was calcified and thickened, and there was some fluid in it.  We did a bursectomy removing all the abnormal tissue.  The gluteal tendons, gluteus medius and minimus,  were very thin and partially detached from the bone.  A  Mitek anchor was placed, and the tendon edges were then advanced down to the anchor with the sutures.  We advanced it and then tied this and oversewed it with Ethibond suture.  This was a very  stable repair.  Wound was copiously irrigated with saline solution.  Meticulous hemostasis was achieved.  20 mL of 0.25% Marcaine with epinephrine was injected into the subcutaneous tissues.  The fascia lata was closed with a running 0 Stratafix suture.   Subcutaneous was closed with interrupted 2-0 Vicryl and subcuticular running 4-0 Monocryl.  The incision was cleaned and dried and Steri-Strips applied.  She was then awakened and transported to recovery in stable condition.   ROH D: 10/20/2021 2:27:49 pm T: 10/20/2021 11:27:00 pm  JOB: [id number]/ [id number]

## 2021-10-21 ENCOUNTER — Encounter (HOSPITAL_COMMUNITY): Payer: Self-pay | Admitting: Orthopedic Surgery

## 2022-04-27 ENCOUNTER — Other Ambulatory Visit: Payer: Self-pay

## 2022-12-11 ENCOUNTER — Other Ambulatory Visit: Payer: Self-pay

## 2022-12-11 ENCOUNTER — Emergency Department (HOSPITAL_BASED_OUTPATIENT_CLINIC_OR_DEPARTMENT_OTHER): Payer: Medicare HMO

## 2022-12-11 ENCOUNTER — Emergency Department (HOSPITAL_BASED_OUTPATIENT_CLINIC_OR_DEPARTMENT_OTHER)
Admission: EM | Admit: 2022-12-11 | Discharge: 2022-12-11 | Disposition: A | Discharge status: Home / Self Care | Payer: Medicare HMO | Attending: Emergency Medicine | Admitting: Emergency Medicine

## 2022-12-11 ENCOUNTER — Encounter (HOSPITAL_BASED_OUTPATIENT_CLINIC_OR_DEPARTMENT_OTHER): Payer: Self-pay | Admitting: Emergency Medicine

## 2022-12-11 DIAGNOSIS — M25552 Pain in left hip: Secondary | ICD-10-CM

## 2022-12-11 DIAGNOSIS — I2510 Atherosclerotic heart disease of native coronary artery without angina pectoris: Secondary | ICD-10-CM | POA: Diagnosis not present

## 2022-12-11 DIAGNOSIS — I10 Essential (primary) hypertension: Secondary | ICD-10-CM | POA: Insufficient documentation

## 2022-12-11 DIAGNOSIS — J449 Chronic obstructive pulmonary disease, unspecified: Secondary | ICD-10-CM | POA: Insufficient documentation

## 2022-12-11 MED ORDER — METHOCARBAMOL 500 MG PO TABS: 500.0000 mg | ORAL_TABLET | Freq: Four times a day (QID) | ORAL | 0 refills | Status: DC | PRN

## 2022-12-11 NOTE — ED Triage Notes (Signed)
Pt has left sided hip pain. Was xrayed and told she has bursitis. Injection given at practice 2 weeks ago but came bc pain too bad. Took hydrocodone from home and takes 2-3 a day. When she sits still and doesn't move, she has no pain. Multiple back and hip replacements and back issues.

## 2022-12-11 NOTE — ED Notes (Signed)
Pt alert, NAD, calm, interactive, to radiology via w/c.

## 2022-12-11 NOTE — ED Provider Notes (Signed)
Emergency Department Provider Note   I have reviewed the triage vital signs and the nursing notes.   HISTORY  Chief Complaint Hip Pain   HPI Karen Woods is a 77 y.o. female past history of chronic joint pain, bilateral hip replacements, COPD presents emergency department with left hip pain.  She is little to no pain at rest but especially when standing from a seated position has pain and tightness the left lateral and slightly posterior hip.  She takes multiple medications for pain including hydrocodone and gabapentin.  She is followed by orthopedics with a follow-up appointment next week but presents today for evaluation of pain and assistance with pain control. No worsening back pain.    Past Medical History:  Diagnosis Date   Anemia    Arthritis    Back pain    Carpal tunnel syndrome    Cataracts, bilateral    Chronic pain    COPD (chronic obstructive pulmonary disease) (HCC)    Mild   Coronary artery disease    Depression    Dyspnea    with exertion   GERD (gastroesophageal reflux disease)    Heart murmur    History of aortic stenosis    Severe   History of prolapse of bladder    Hyperlipidemia    Hypertension    Osteoporosis    Pneumonia    PONV (postoperative nausea and vomiting)    Pre-diabetes    Pulmonary hypertension (HCC)    Mild   Small vessel disease, cerebrovascular    Tremor    Tremor of right hand    Vertebrobasilar artery insufficiency    Vertigo     Review of Systems  Constitutional: No fever/chills Cardiovascular: Denies chest pain. Respiratory: Denies shortness of breath. Gastrointestinal: No abdominal pain.  Genitourinary: Negative for dysuria. Musculoskeletal: Positive chronic back pain. Positive left hip pain.  Skin: Negative for rash. Neurological: Negative for headaches, focal weakness or numbness.   ____________________________________________   PHYSICAL EXAM:  VITAL SIGNS: ED Triage Vitals  Enc Vitals Group     BP  12/11/22 1315 (!) 149/84     Pulse Rate 12/11/22 1313 71     Resp 12/11/22 1313 18     Temp 12/11/22 1313 99.2 F (37.3 C)     Temp Source 12/11/22 1313 Oral     SpO2 12/11/22 1313 100 %   Constitutional: Alert and oriented. Well appearing and in no acute distress. Eyes: Conjunctivae are normal.  Head: Atraumatic. Nose: No congestion/rhinnorhea. Mouth/Throat: Mucous membranes are moist.  Neck: No stridor.  Cardiovascular: Normal rate, regular rhythm. Good peripheral circulation. Grossly normal heart sounds.   Respiratory: Normal respiratory effort.  No retractions. Lungs CTAB. Gastrointestinal: Soft and nontender. No distention.  Musculoskeletal: No lower extremity tenderness nor edema. No gross deformities of extremities.  Normal range of motion of the bilateral hips.  No outward sign of cellulitis or other rash. Neurologic:  Normal speech and language. No gross focal neurologic deficits are appreciated.  Skin:  Skin is warm, dry and intact. No rash noted.  ____________________________________________  [id number]  DG Hip Unilat W or Wo Pelvis 2-3 Views Left  Result Date: 12/11/2022 CLINICAL DATA:  Pain left-sided EXAM: DG HIP (WITH OR WITHOUT PELVIS) 3V LEFT COMPARISON: 03/03/2019 x-ray FINDINGS: Bilateral hip arthroplasties are identified with Press-Fit components. No hardware failure. The extreme distal tip of the femoral component on the right is not included in the imaging field of the left hip examination. There is also a burr  noted along the right greater trochanter. No underlying fracture or dislocation. Preserved bone mineralization. Hyperostosis. Fixation hardware along the lower lumbar spine at the edge of the imaging field. IMPRESSION: Bilateral hip arthroplasty. Electronically Signed   By: Jill Side M.D.   On: 12/11/2022 15:05    ____________________________________________   PROCEDURES  Procedure(s) performed:   Procedures  None   ____________________________________________   INITIAL IMPRESSION / ASSESSMENT AND PLAN / ED COURSE  Pertinent labs & imaging results that were available during my care of the patient were reviewed by me and considered in my medical decision making (see chart for details).   This patient is Presenting for Evaluation of hip pain, which does require a range of treatment options, and is a complaint that involves a moderate risk of morbidity and mortality.  The Differential Diagnoses include fracture, dislocation, strain, cellulitis, septic joint, etc.   Radiologic Tests Ordered, included hip x-ray. I independently interpreted the images and agree with radiology interpretation.   Medical Decision Making: Summary:  Patient presents emergency department acute on chronic hip pain.  She has bilateral replacements of the hip send x-ray shows the prostheses are well-seated.  No periprosthetic fracture or dislocation.  No infection symptoms to suspect deeper space infection.  Discussed pain management strategies with the patient in detail at bedside.  She has follow-up with her orthopedist in the coming week.   Patient's presentation is most consistent with exacerbation of chronic illness.   Disposition: discharge  ____________________________________________  FINAL CLINICAL IMPRESSION(S) / ED DIAGNOSES  Final diagnoses:  Left hip pain    Note:  This document was prepared using Dragon voice recognition software and may include unintentional dictation errors.  Nanda Quinton, MD, [facility]    Cheryel Kyte, Wonda [facility], MD 12/12/22 [phone]

## 2022-12-11 NOTE — ED Notes (Signed)
Pt not in room.

## 2022-12-11 NOTE — Discharge Instructions (Signed)

## 2022-12-11 NOTE — ED Notes (Signed)
Reviewed discharge instructions, recommendations and medications with pt. Pt states understanding. Pt ambulatory at discharge

## 2022-12-30 ENCOUNTER — Emergency Department (HOSPITAL_BASED_OUTPATIENT_CLINIC_OR_DEPARTMENT_OTHER): Payer: Medicare HMO

## 2022-12-30 ENCOUNTER — Encounter (HOSPITAL_BASED_OUTPATIENT_CLINIC_OR_DEPARTMENT_OTHER): Payer: Self-pay | Admitting: Emergency Medicine

## 2022-12-30 ENCOUNTER — Emergency Department (HOSPITAL_BASED_OUTPATIENT_CLINIC_OR_DEPARTMENT_OTHER)
Admission: EM | Admit: 2022-12-30 | Discharge: 2022-12-30 | Disposition: A | Discharge status: Home / Self Care | Payer: Medicare HMO | Attending: Emergency Medicine | Admitting: Emergency Medicine

## 2022-12-30 ENCOUNTER — Other Ambulatory Visit: Payer: Self-pay

## 2022-12-30 DIAGNOSIS — K529 Noninfective gastroenteritis and colitis, unspecified: Secondary | ICD-10-CM | POA: Insufficient documentation

## 2022-12-30 DIAGNOSIS — Z79899 Other long term (current) drug therapy: Secondary | ICD-10-CM | POA: Diagnosis not present

## 2022-12-30 DIAGNOSIS — Z7982 Long term (current) use of aspirin: Secondary | ICD-10-CM | POA: Insufficient documentation

## 2022-12-30 DIAGNOSIS — R112 Nausea with vomiting, unspecified: Secondary | ICD-10-CM | POA: Diagnosis present

## 2022-12-30 DIAGNOSIS — I2510 Atherosclerotic heart disease of native coronary artery without angina pectoris: Secondary | ICD-10-CM | POA: Insufficient documentation

## 2022-12-30 DIAGNOSIS — I10 Essential (primary) hypertension: Secondary | ICD-10-CM | POA: Insufficient documentation

## 2022-12-30 DIAGNOSIS — J449 Chronic obstructive pulmonary disease, unspecified: Secondary | ICD-10-CM | POA: Insufficient documentation

## 2022-12-30 LAB — CBC WITH DIFFERENTIAL/PLATELET
Abs Immature Granulocytes: 0.14 K/uL — ABNORMAL HIGH (ref 0.00–0.07)
Basophils Absolute: 0.1 K/uL (ref 0.0–0.1)
Basophils Relative: 1 %
Eosinophils Absolute: 0.2 K/uL (ref 0.0–0.5)
Eosinophils Relative: 3 %
HCT: 34.2 % — ABNORMAL LOW (ref 36.0–46.0)
Hemoglobin: 11.3 g/dL — ABNORMAL LOW (ref 12.0–15.0)
Immature Granulocytes: 2 %
Lymphocytes Relative: 13 %
Lymphs Abs: 0.7 K/uL (ref 0.7–4.0)
MCH: 30.1 pg (ref 26.0–34.0)
MCHC: 33 g/dL (ref 30.0–36.0)
MCV: 91 fL (ref 80.0–100.0)
Monocytes Absolute: 0.9 K/uL (ref 0.1–1.0)
Monocytes Relative: 16 %
Neutro Abs: 3.8 K/uL (ref 1.7–7.7)
Neutrophils Relative %: 65 %
Platelets: 178 K/uL (ref 150–400)
RBC: 3.76 MIL/uL — ABNORMAL LOW (ref 3.87–5.11)
RDW: 14.5 % (ref 11.5–15.5)
Smear Review: NORMAL
WBC: 5.9 K/uL (ref 4.0–10.5)
nRBC: 0 % (ref 0.0–0.2)

## 2022-12-30 LAB — URINALYSIS, MICROSCOPIC (REFLEX)

## 2022-12-30 LAB — URINALYSIS, ROUTINE W REFLEX MICROSCOPIC
Bilirubin Urine: NEGATIVE
Glucose, UA: NEGATIVE mg/dL
Ketones, ur: NEGATIVE mg/dL
Leukocytes,Ua: NEGATIVE
Nitrite: NEGATIVE
Protein, ur: >=300 mg/dL — AB
Specific Gravity, Urine: 1.025 (ref 1.005–1.030)
pH: 6.5 (ref 5.0–8.0)

## 2022-12-30 LAB — COMPREHENSIVE METABOLIC PANEL WITH GFR
ALT: 30 U/L (ref 0–44)
AST: 36 U/L (ref 15–41)
Albumin: 3.2 g/dL — ABNORMAL LOW (ref 3.5–5.0)
Alkaline Phosphatase: 46 U/L (ref 38–126)
Anion gap: 8 (ref 5–15)
BUN: 16 mg/dL (ref 8–23)
CO2: 27 mmol/L (ref 22–32)
Calcium: 8.9 mg/dL (ref 8.9–10.3)
Chloride: 100 mmol/L (ref 98–111)
Creatinine, Ser: 0.81 mg/dL (ref 0.44–1.00)
GFR, Estimated: >60 mL/min
Glucose, Bld: 123 mg/dL — ABNORMAL HIGH (ref 70–99)
Potassium: 4.8 mmol/L (ref 3.5–5.1)
Sodium: 135 mmol/L (ref 135–145)
Total Bilirubin: 1.2 mg/dL (ref 0.3–1.2)
Total Protein: 6.6 g/dL (ref 6.5–8.1)

## 2022-12-30 LAB — LIPASE, BLOOD: Lipase: 24 U/L (ref 11–51)

## 2022-12-30 MED ORDER — SODIUM CHLORIDE 0.9 % IV BOLUS
1000.0000 mL | Freq: Once | INTRAVENOUS | Status: AC
  Administered 2022-12-30: 1000 mL via INTRAVENOUS

## 2022-12-30 MED ORDER — IOHEXOL 300 MG/ML  SOLN
100.0000 mL | Freq: Once | INTRAMUSCULAR | Status: AC | PRN
  Administered 2022-12-30: 100 mL via INTRAVENOUS

## 2022-12-30 MED ORDER — ONDANSETRON 4 MG PO TBDP
4.0000 mg | ORAL_TABLET | Freq: Once | ORAL | Status: AC
  Administered 2022-12-30: 4 mg via ORAL
  Filled 2022-12-30: qty 1

## 2022-12-30 MED ORDER — AMOXICILLIN-POT CLAVULANATE 875-125 MG PO TABS: 1.0000 | ORAL_TABLET | Freq: Two times a day (BID) | ORAL | 0 refills | Status: DC

## 2022-12-30 MED ORDER — ACETAMINOPHEN 325 MG PO TABS
650.0000 mg | ORAL_TABLET | Freq: Once | ORAL | Status: AC
  Administered 2022-12-30: 650 mg via ORAL
  Filled 2022-12-30: qty 2

## 2022-12-30 MED ORDER — ONDANSETRON HCL 4 MG/2ML IJ SOLN
4.0000 mg | Freq: Once | INTRAMUSCULAR | Status: AC
Start: 2022-12-30 — End: 2022-12-30
  Administered 2022-12-30: 4 mg via INTRAVENOUS
  Filled 2022-12-30: qty 2

## 2022-12-30 MED ORDER — AMOXICILLIN-POT CLAVULANATE 875-125 MG PO TABS: 1.0000 | ORAL_TABLET | Freq: Two times a day (BID) | ORAL | 0 refills | Status: AC

## 2022-12-30 MED ORDER — AMOXICILLIN-POT CLAVULANATE 875-125 MG PO TABS
1.0000 | ORAL_TABLET | Freq: Once | ORAL | Status: AC
  Administered 2022-12-30: 1 via ORAL
  Filled 2022-12-30: qty 1

## 2022-12-30 NOTE — ED Triage Notes (Signed)
Pt w/ generalized abd pain since Sunday; now w/ diarrhea and general malaise/fatigue

## 2022-12-30 NOTE — ED Provider Notes (Signed)
[facility] Provider Note   CSN: [id number] Arrival date & time: 12/30/22  1846     History  Chief Complaint  Patient presents with   Abdominal Pain    Karen Woods is a 77 y.o. female.  Patient here with nausea vomiting diarrhea and abdominal pain for the last few days.  Nothing makes it worse or better.  No suspicious food intake.  Multiple sick contacts.  History of CAD, reflux, high cholesterol, hypertension.  Denies any chest pain or shortness of breath.  She feels dehydrated.  The history is provided by the patient.       Home Medications Prior to Admission medications   Medication Sig Start Date End Date Taking? Authorizing Provider  amoxicillin-clavulanate (AUGMENTIN) 875-125 MG tablet Take 1 tablet by mouth 2 (two) times daily for 7 days. 12/30/22 01/06/23 Yes Bryce Cheever, DO  albuterol (PROVENTIL) (5 MG/ML) 0.5% nebulizer solution Take 2.5 mg by nebulization every 6 (six) hours as needed for wheezing or shortness of breath.    [provider]  albuterol (VENTOLIN HFA) 108 (90 Base) MCG/ACT inhaler Inhale 1-2 puffs into the lungs every 6 (six) hours as needed for wheezing or shortness of breath.    [provider]  aspirin EC 81 MG tablet Take 81 mg by mouth daily. Swallow whole.    [provider]  Calcium Carb-Cholecalciferol (CALCIUM + D3 PO) Take 1 tablet by mouth 2 (two) times daily.    [provider]  cycloSPORINE (RESTASIS) 0.05 % ophthalmic emulsion Place 1 drop into both eyes 2 (two) times daily.    [provider]  estradiol (ESTRACE) 0.5 MG tablet Take 0.5 mg by mouth daily.    [provider]  gabapentin (NEURONTIN) 800 MG tablet Take 800 mg by mouth 3 (three) times daily.     [provider]  HYDROcodone-acetaminophen (NORCO) 10-325 MG tablet Take 1 tablet by mouth 3 (three) times daily.    [provider]  methocarbamol (ROBAXIN) 500 MG  tablet Take 1 tablet (500 mg total) by mouth every 6 (six) hours as needed for muscle spasms. 12/11/22   Long, Arlyss Repress, MD  metoprolol tartrate (LOPRESSOR) 25 MG tablet Take 25 mg by mouth 2 (two) times daily.    [provider]  Multiple Vitamins-Minerals (MULTIVITAMIN WITH MINERALS) tablet Take 1 tablet by mouth daily.    [provider]  omeprazole (PRILOSEC) 40 MG capsule Take 40 mg by mouth daily.    [provider]  predniSONE (DELTASONE) 5 MG tablet Take 5 mg by mouth 2 (two) times daily.    [provider]  rosuvastatin (CRESTOR) 40 MG tablet Take 40 mg by mouth daily.    [provider]  SYMBICORT 160-4.5 MCG/ACT inhaler Inhale 2 puffs into the lungs 2 (two) times daily as needed (shortness of breath). 11/04/20   [provider]      Allergies    Sulfamethoxazole, Morphine and related, Oxycodone, and Codeine    Review of Systems   Review of Systems  Physical Exam Updated Vital Signs BP 105/60   Pulse 89   Temp 99.8 F (37.7 C)   Resp 16   Ht 5\' 4"  (1.626 m)   Wt 69.4 kg   SpO2 94%   BMI 26.26 kg/m  Physical Exam Vitals and nursing note reviewed.  Constitutional:      General: She is not in acute distress.    Appearance: She is well-developed. She  is not ill-appearing.  HENT:     Head: Normocephalic and atraumatic.     Mouth/Throat:     Mouth: Mucous membranes are moist.  Eyes:     Extraocular Movements: Extraocular movements intact.     Conjunctiva/sclera: Conjunctivae normal.     Pupils: Pupils are equal, round, and reactive to light.  Cardiovascular:     Rate and Rhythm: Normal rate and regular rhythm.     Pulses: Normal pulses.     Heart sounds: Normal heart sounds. No murmur heard. Pulmonary:     Effort: Pulmonary effort is normal. No respiratory distress.     Breath sounds: Normal breath sounds.  Abdominal:     Palpations: Abdomen is soft.     Tenderness: There is generalized abdominal tenderness.   Musculoskeletal:     Cervical back: Neck supple.  Skin:    General: Skin is warm and dry.     Capillary Refill: Capillary refill takes less than 2 seconds.  Neurological:     General: No focal deficit present.     Mental Status: She is alert.     ED Results / Procedures / Treatments   Labs (all labs ordered are listed, but only abnormal results are displayed) Labs Reviewed  CBC WITH DIFFERENTIAL/PLATELET - Abnormal; Notable for the following components:      Result Value   RBC 3.76 (*)    Hemoglobin 11.3 (*)    HCT 34.2 (*)    Abs Immature Granulocytes 0.14 (*)    All other components within normal limits  COMPREHENSIVE METABOLIC PANEL - Abnormal; Notable for the following components:   Glucose, Bld 123 (*)    Albumin 3.2 (*)    All other components within normal limits  URINALYSIS, ROUTINE W REFLEX MICROSCOPIC - Abnormal; Notable for the following components:   Hgb urine dipstick TRACE (*)    Protein, ur >=300 (*)    All other components within normal limits  URINALYSIS, MICROSCOPIC (REFLEX) - Abnormal; Notable for the following components:   Bacteria, UA RARE (*)    All other components within normal limits  LIPASE, BLOOD    EKG None  Radiology CT ABDOMEN PELVIS W CONTRAST  Result Date: 12/30/2022 CLINICAL DATA:  Abdominal pain, acute, nonlocalized. EXAM: CT ABDOMEN AND PELVIS WITH CONTRAST TECHNIQUE: Multidetector CT imaging of the abdomen and pelvis was performed using the standard protocol following bolus administration of intravenous contrast. RADIATION DOSE REDUCTION: This exam was performed according to the departmental dose-optimization program which includes automated exposure control, adjustment of the mA and/or kV according to patient size and/or use of iterative reconstruction technique. CONTRAST:  100mL OMNIPAQUE IOHEXOL 300 MG/ML  SOLN COMPARISON:  None Available. FINDINGS: Lower chest: No acute abnormality. Hepatobiliary: No focal liver abnormality is seen.  Fatty infiltration of the liver is noted. No gallstones, gallbladder wall thickening, or biliary dilatation. Pancreas: Unremarkable. No pancreatic ductal dilatation or surrounding inflammatory changes. Spleen: Normal in size without focal abnormality. Adrenals/Urinary Tract: No renal calculus or hydronephrosis. The kidneys enhance symmetrically. No renal calculus or hydronephrosis. The bladder is unremarkable. Stomach/Bowel: Stomach is within normal limits. Appendix is not seen. There are few loops of mildly distended small bowel in the pelvis measuring up to 3.6 cm in diameter with no evidence of obstruction. Distal to this segment there is bowel wall thickening with mucosal enhancement involving the distal and terminal ileum with surrounding fat stranding. No free air or pneumatosis. Scattered diverticula are present along the sigmoid colon without evidence of diverticulitis.  Vascular/Lymphatic: Aortic atherosclerosis. There is a nonspecific prominent lymph node at the porta hepatis measuring up to 1 cm. Reproductive: Status post hysterectomy. No adnexal masses. Other: No abdominopelvic ascites. Musculoskeletal: Bilateral total hip arthroplasty changes are noted. A fat containing umbilical hernia is present. Fixation hardware is noted in the thoracolumbar spine. No acute osseous abnormality. IMPRESSION: 1. Bowel wall thickening involving the distal and terminal ileum with mucosal enhancement, suggesting infectious or inflammatory enteritis. The loops of small bowel proximal to this are mildly distended with no evidence of obstruction. Crohn's disease should be considered in the differential diagnosis. 2. Hepatic steatosis. 3. Sigmoid diverticulosis without evidence of diverticulitis. 4. Aortic atherosclerosis. Electronically Signed   By: Thornell Sartorius M.D.   On: 12/30/2022 21:17    Procedures Procedures    Medications Ordered in ED Medications  sodium chloride 0.9 % bolus 1,000 mL (0 mLs Intravenous  Stopped 12/30/22 2130)  ondansetron (ZOFRAN) injection 4 mg (4 mg Intravenous Given 12/30/22 1942)  acetaminophen (TYLENOL) tablet 650 mg (650 mg Oral Given 12/30/22 1941)  iohexol (OMNIPAQUE) 300 MG/ML solution 100 mL (100 mLs Intravenous Contrast Given 12/30/22 2039)  amoxicillin-clavulanate (AUGMENTIN) 875-125 MG per tablet 1 tablet (1 tablet Oral Given 12/30/22 2222)    ED Course/ Medical Decision Making/ A&P                             Medical Decision Making Amount and/or Complexity of Data Reviewed Labs: ordered. Radiology: ordered.  Risk OTC drugs. Prescription drug management.   Kairi Archilla is here with nausea vomiting, diarrhea, abdominal pain.  History of reflux, hypertension, high cholesterol, COPD.  Differential diagnosis likely viral process but could be colitis or enteritis or less likely bowel obstruction.  Will get CBC, CMP, lipase, CT scan abdomen and pelvis.  Will give IV fluids, IV Zofran and reevaluate.  Per CT report there is some mild enteritis.  Overall suspect that is the cause of her symptoms.  Will prescribe Augmentin.  Blood work for my review and interpretation otherwise unremarkable.  No significant anemia or leukocytosis or electrolyte abnormality.  Discharged in good condition.  Feeling much better after IV fluids.  This chart was dictated using voice recognition software.  Despite best efforts to proofread,  errors can occur which can change the documentation meaning.         Final Clinical Impression(s) / ED Diagnoses Final diagnoses:  Enteritis    Rx / DC Orders ED Discharge Orders          Ordered    amoxicillin-clavulanate (AUGMENTIN) 875-125 MG tablet  2 times daily        12/30/22 2222              Virgina Norfolk, DO 12/30/22 2223

## 2023-12-18 IMAGING — CR DG HIP (WITH OR WITHOUT PELVIS) 2-3V*R*
3 series · 3 of 3 positions shown · non-contrast
Comparison: None.

CLINICAL DATA: Right hip injury

EXAM:
DG HIP (WITH OR WITHOUT PELVIS) 2-3V RIGHT

[t pelvis ap]
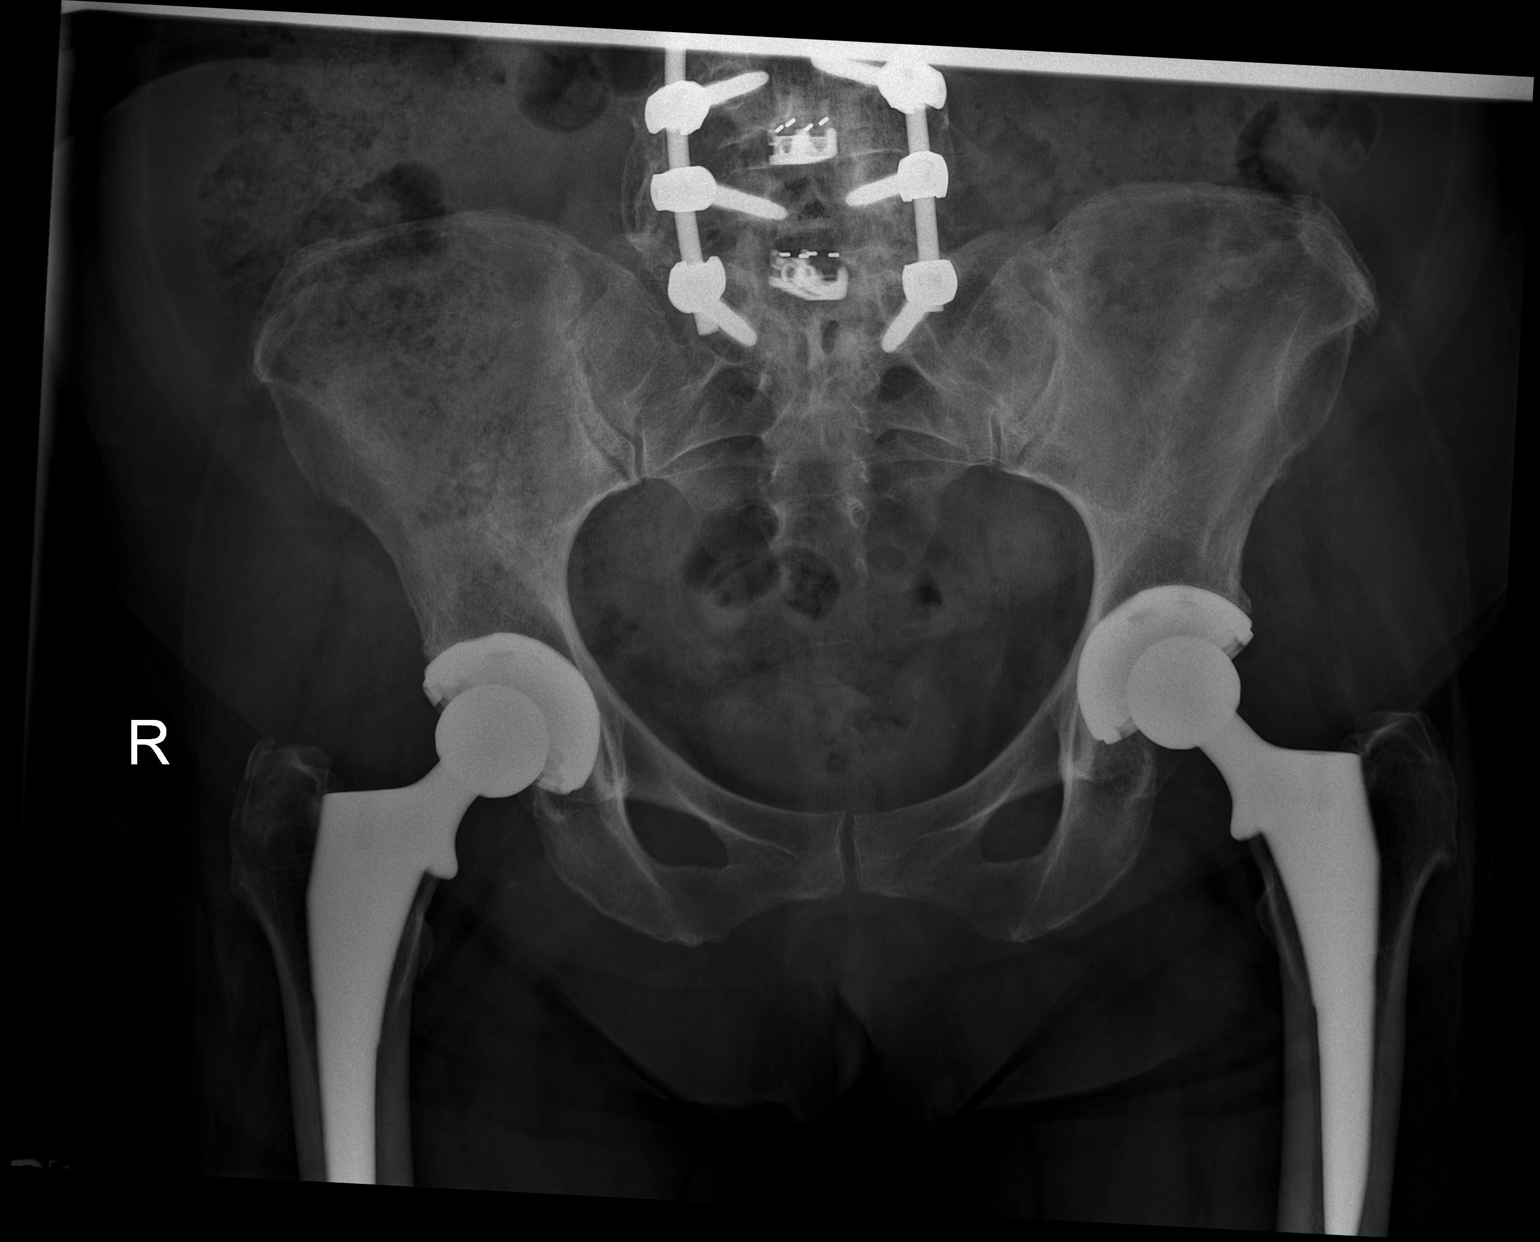

[t hip ap right]
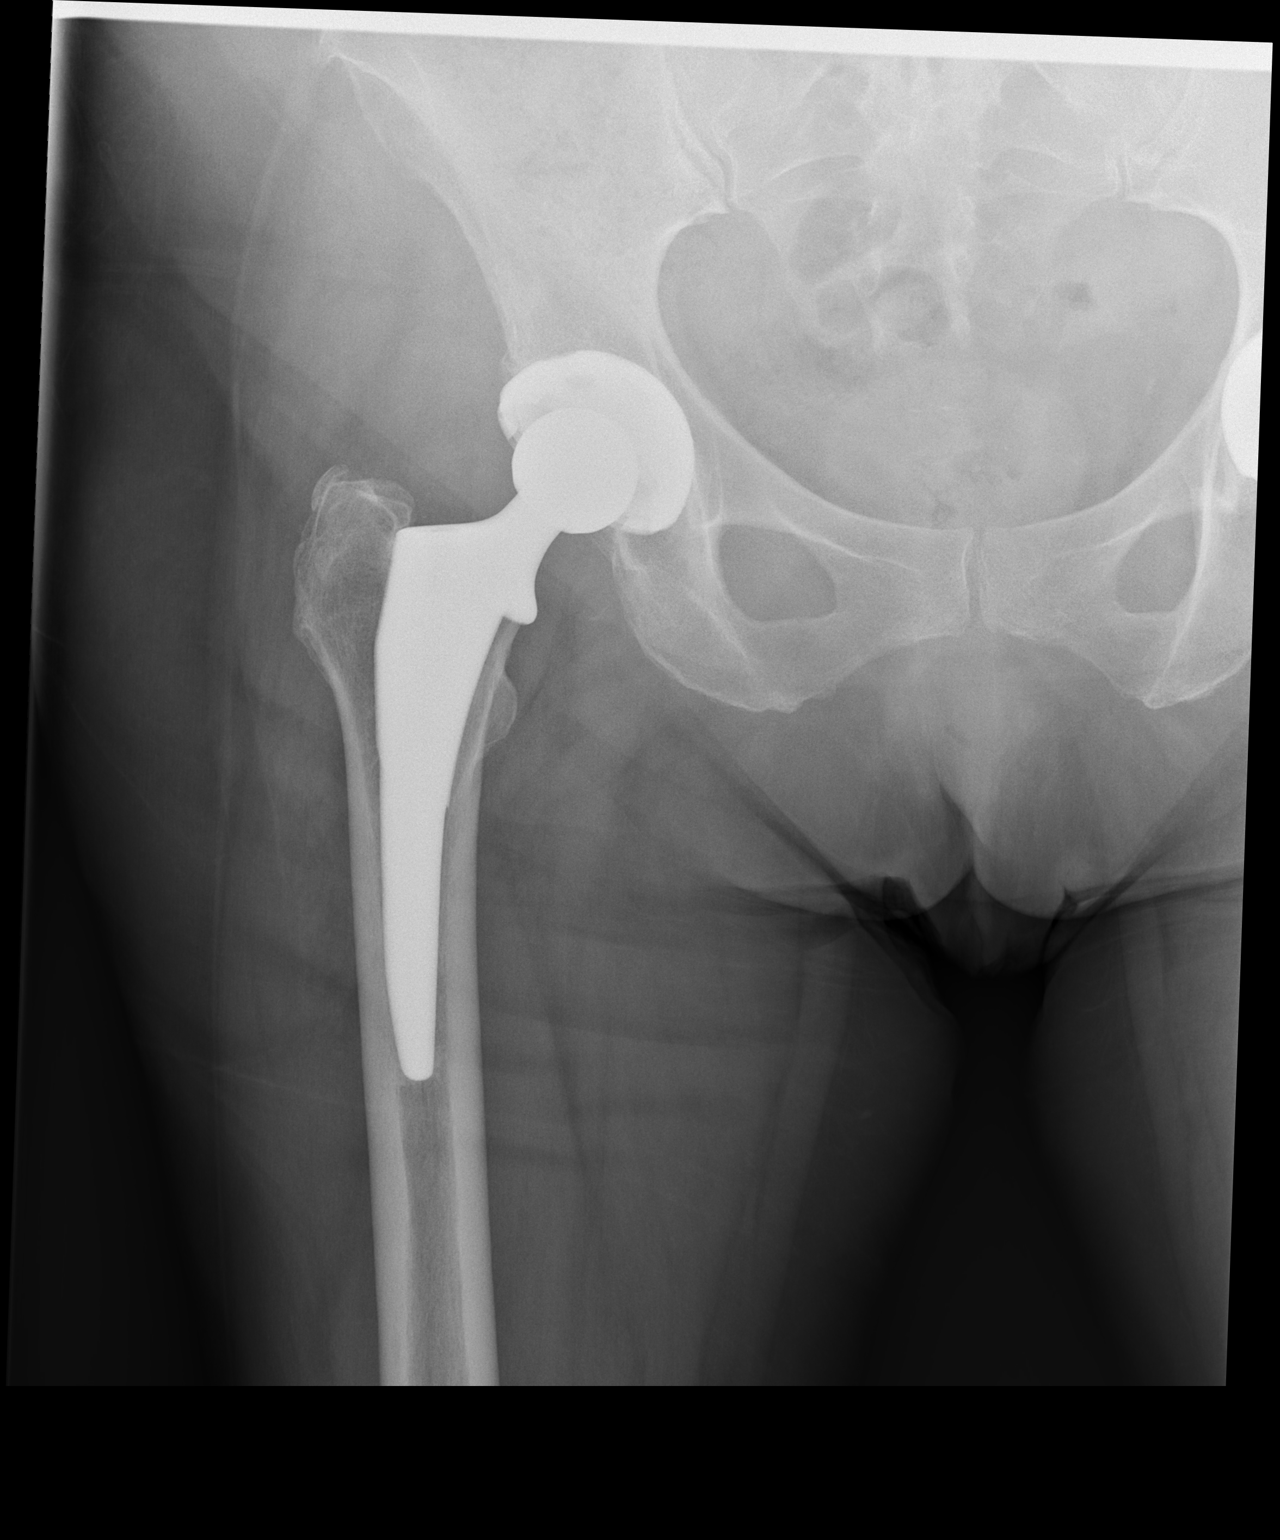

[t hip frog leg right]
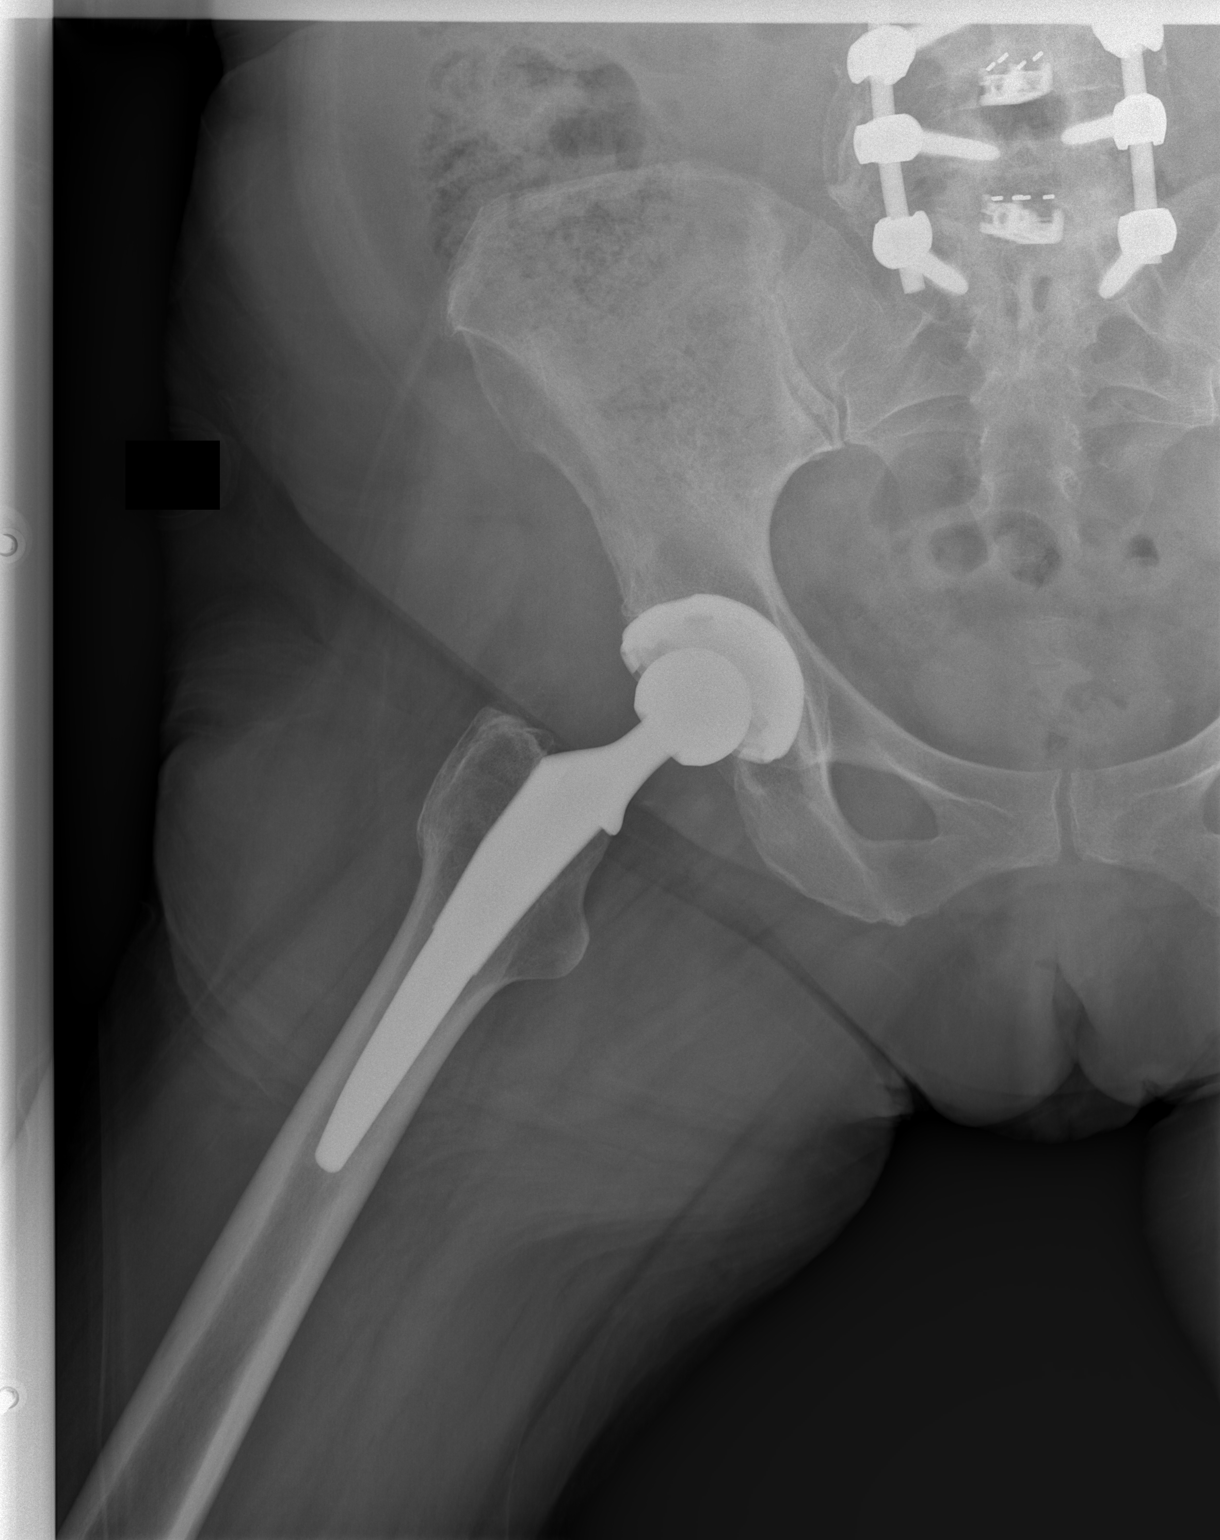

[3 of 3 positions shown; findings below may reference images not displayed]

FINDINGS: No acute fracture or dislocation identified. There is a right hip
prosthesis in place with no periprosthetic fracture or lucency
visualized.
IMPRESSION: No acute osseous abnormality identified.

## 2024-02-10 NOTE — Progress Notes (Signed)
 Assessment 1. Primary osteoarthritis involving multiple joints      2. DDD (degenerative disc disease), cervical      3. Chronic low back pain, unspecified back pain laterality, unspecified whether sciatica present        Plan/Orders Her symptoms are consistent with osteoarthritis. There is no disease modifying medications used to treat osteoarthritis. She is already on hydrocodone  and NSAID. We discussed supplements. She will continue pain management.  Chief Complaint Karen Woods is here for arthritis.   HPI 78 yo seen as a new patient for arthritis. Medical history of arthritis, GERD, HTN, degenerative spine disease. Uric acid 5.5 RAF - CRP < 5 ESR 23 History of 3 spinal surgery, both hips replaced, right shoulder replacement. Pain in neck, shoulders, hands, wrists, toes, feet. She takes hydrocodone , celebrex, Lyrica. She tried gabapentin  and voltaren gel.  X rays of hands showed advanced degenerative changes.  Pain constant worse at night. No psoriasis. No bowel or eye inflammation.   Vital Signs BP 154/77 (BP Location: Left arm, Patient Position: Sitting)   Pulse 66   Ht 1.626 m (5' 4)   Wt 73.4 kg (161 lb 12.8 oz)   LMP  (LMP Unknown)   SpO2 98%   BMI 27.77 kg/m  Physical Exam, New Patient Pt is alert and oriented.  Answers questions appropriately  Eyes: pupils reactive to light and accommodation. There is no conjunctivitis. Mouth: moist moth mucosa normal pharynx.  Sinuses: no sinuses tenderness on palpation. Neck: no abnormal masses visible or palpable. Thyroid non tender, no enlargement. Heart: normal S1, S2. No S3, no murmur, no thrills. No JVD. Lungs: no dyspnea observed, normal breath sounds on auscultation. Extremities: no cyanosis, no edema, no calves tenderness. Skin: warm, dry. Lymphatic system: no lymphadenopathy in cervical and supraclavicular lymph nodes groups. Neuro: Gait normal. Motor functions intact,  Musculoskeletal exam: no signs of  muscular atrophy, no muscular tenderness on palpation in any muscle group. Uses a cane. Abnormal gait. Advanced nodular OA hands.   PMH Past Medical History:  Diagnosis Date  . COPD (chronic obstructive pulmonary disease)    (CMD)   . Hypertension   . Lumbar stenosis     PSH History reviewed. No pertinent surgical history.  Allergies Morphine, Sulfamethoxazole, Oxycodone , and Codeine  Medications Current Outpatient Medications on File Prior to Visit  Medication Sig Dispense Refill  . albuterol  5 mg/mL nebulizer solution 1 inhalation every 4 - 6 hours as needed. 5 mL 5  . albuterol  HFA (PROVENTIL  HFA;VENTOLIN  HFA;PROAIR  HFA) 90 mcg/actuation inhaler Inhale 2 puffs every 4 (four) hours as needed for shortness of breath. 1 each 5  . albuterol  sulfate 2.5 mg/0.5 mL nebu nebulizer solution Take 2.5 mg by nebulization every 6 (six) hours as needed.    SABRA amitriptyline (ELAVIL) 10 mg tablet     . aspirin  81 mg EC tablet Take 81 mg by mouth Once Daily.    . budesonide-formoteroL (Symbicort) 160-4.5 mcg/actuation inhaler Inhale 2 puffs  in the morning and 2 puffs before bedtime. Rinse mouth after use.. 1 each 5  . calcium  phosphate-vitamin D3 (Calcium  Adult, calcium  phos,) 250 mg-8.75 mcg (350 unit) chew Chew 1 tablet daily.    . Cequa  0.09 % dpet Administer 1 drop into each eyes 2 (two) times a day.    . cloNIDine (CATAPRES) 0.1 mg tablet Take 1 tablet (0.1 mg total) by mouth 2 (two) times a day as needed for high blood pressure (SBP > 190) for up to 10 doses. 10  tablet 0  . doxycycline (VIBRAMYCIN) 100 mg capsule Take 1 capsule by mouth in the morning and 1 capsule in the evening.    SABRA esomeprazole (NexIUM) 40 mg DR capsule Take 40 mg by mouth every morning before breakfast.    . estradioL (ESTRACE) 0.5 mg tablet TAKE 1 TABLET BY MOUTH ONCE DAILY 90 tablet 1  . HYDROcodone -acetaminophen  (NORCO) 10-325 mg per tablet Take one tablet by mouth every 8 (eight) hours as needed for severe pain  (7-10). 90 tablet 0  . hydrOXYzine (ATARAX) 25 mg tablet     . lidocaine -prilocaine (EMLA) 2.5-2.5 % cream Apply topically as needed.    . metoclopramide  (REGLAN ) 10 mg tablet Take 1 tablet (10 mg total) by mouth every 8 (eight) hours as needed (nausea). Take every 8 hours until Monday 30 tablet 0  . metoprolol  tartrate (LOPRESSOR ) 25 mg tablet Take 1 tablet (25 mg total) by mouth 2 (two) times a day. 60 tablet 0  . multivitamin (THERAGRAN) tab tablet Take 1 tablet by mouth Once Daily.    SABRA omeprazole (PriLOSEC) 40 mg DR capsule Take 1 capsule by mouth Once Daily.    . ondansetron  (ZOFRAN -ODT) 8 mg disintegrating tablet     . predniSONE (DELTASONE) 1 mg tablet Take 1 mg by mouth 2 (two) times a day.    . pregabalin (LYRICA) 225 mg capsule Take 225 mg by mouth 2 (two) times a day.    . rosuvastatin  (CRESTOR ) 40 mg tablet Take 1 tablet (40 mg total) by mouth daily. 90 tablet 1  . triamcinolone acetonide (KENALOG) 0.1 % cream     . [DISCONTINUED] meloxicam (MOBIC) 7.5 mg tablet Take 7.5 mg by mouth daily.    . budesonide-formoteroL (Symbicort) 160-4.5 mcg/actuation inhaler Inhale 2 puffs  in the morning and 2 puffs before bedtime. (Patient not taking: Reported on 01/18/2024) 1 each 5  . famotidine (PEPCID) 20 mg tablet Take 1 tablet (20 mg total) by mouth 2 (two) times a day for 15 days. 30 tablet 0  . [DISCONTINUED] oxyBUTYnin (DITROPAN XL) 5 mg 24 hr tablet TAKE 1 TABLET BY MOUTH ONCE DAILY (Patient not taking: Reported on 02/10/2024) 30 tablet 0   No current facility-administered medications on file prior to visit.    Family Hx Family History  Problem Relation Name Age of Onset  . Stroke Mother    . Diabetes Father    . Heart disease Brother      Social Hx  Social History   Tobacco Use  . Smoking status: Former    Types: Cigarettes  . Smokeless tobacco: Never  Vaping Use  . Vaping status: Never Used  Substance Use Topics  . Alcohol use: Never  . Drug use: Never

## 2024-04-26 NOTE — Progress Notes (Signed)
 HPI:  Karen Woods returns for unscheduled follow-up visit.  That second visit to our clinic.  Initially patient was evaluated in our clinic 2 weeks ago for difficult to control hypertension associated with very labile blood pressure readings with frequent spikes in the blood pressure.  For particulars of patient's complicated medical history see my extensive clinic note from July 25th.  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  Patient mentioned that 2 days ago she had another episode this time in the morning when she developed nausea, runny nose sore throat stomach and took her blood pressure and was 180/110.  Patient reportedly took her 1 tablet of clonidine 0.1 mg (was prescribed following her initial evaluation) and blood pressure dropped within half an hour to 128/78.  Following this episode patient contact our clinic and made an appointment to come today.  Earlier today patient also had renal duplex performed which demonstrated no evidence of critical renal artery stenosis.  10 patient resistive index was normal.  Right kidney measured 11.5 cm in length and left 10.9.  Overall test was normal.  Initially, we planned patient to come to [facility] and get 20 formboard for blood pressure monitoring.  Today patient mentioned that she does not feel comfortable traveling to [facility] (I drive alone and I do not like the trafficking big city and sometimes I have problem finding somebody to accompany me distant visits).  Today in the clinic patient had no new complaints.  Patient mentioned being anxious about open having a stroke and be a vegetable.  Today in the clinic patient was asymptomatic.  Patient did not bring log of blood pressure readings (patient was not checking her blood pressure regularly only unless she had episodes.  Typically these occur infrequently once every 2 or 3 weeks according to the  patient.  ROS:  All systems were briefly reviewed again and nothing change from initial examination.  MEDICATIONS:  List of medication reviewed.  Patient coated aspirin  81 mg daily.  Patient quit taking losartan 25 mg daily.  Clonidine 0.1 mg as needed spike in the blood pressure.  Rest of medications reviewed.  No new side effects or urgency reported.  Patient mentioned I like to take less medication if possible.  PE:  On examination pleasant in no distress, appears to be anxious.  Weight 155 pounds (no change).  O2 saturation 95%.  Temperature 98.1 F.  Heart rate 71 regular sitting, 75 standing.  Blood pressure with regular cuff in the right arm sitting 148/80, standing 152/77.  Neck: Supple, no JVD.  No carotid bruits.  Lungs: Clear to station no crackles or rails.  Legs: No ankle swelling bilaterally.  Patient was moving her feet all the time during the encounter which makes me feel much better in the lower legs are not as stiff.  No ischemic changes.  No changes rest examination.  IMPRESSION AND PLAN:  Karen Nissley returns for follow-up visit which is unscheduled after had additional episode associated with spike in the blood pressure.  As mentioned patient does not feel comfortable to travel to [facility] to get 24 ambulatory blood pressure monitor placed.  Patient tries to stay physically active.  Patient reported no headaches.  Patient at times have has dizziness.  No falls.  Patient with multiple lower extremity symptoms suggestive of venous insufficiency.  Patient with the symptoms signs of mild to moderate in severity dysautonomia.  I explained the patient the concept of the  condition.  I asked patient to start monitoring blood pressure at home regularly (before breakfast and supper and keep a log of blood pressure readings.  If in the morning systolic blood pressure greater than 180, patient can take clonidine 0.1 mg one half a tablet.  If the same elevated blood pressure above 180s  in the evening patient should take a whole tablet of clonidine 0.1 mg.  I plan to follow patient back in 2 weeks.  Asked patient to bring a log of blood pressure readings.  Today we had lengthy discussion with the patient.  We spent more than 30 minutes, and follow-up appointment was given.  Patient was asked to contact me anytime she has any question concerns.  More than 25 minutes in direct contact with the patient.  Questions were answered.

## 2024-05-27 ENCOUNTER — Emergency Department (HOSPITAL_BASED_OUTPATIENT_CLINIC_OR_DEPARTMENT_OTHER)

## 2024-05-27 ENCOUNTER — Encounter (HOSPITAL_BASED_OUTPATIENT_CLINIC_OR_DEPARTMENT_OTHER): Payer: Self-pay

## 2024-05-27 ENCOUNTER — Other Ambulatory Visit: Payer: Self-pay

## 2024-05-27 ENCOUNTER — Emergency Department (HOSPITAL_BASED_OUTPATIENT_CLINIC_OR_DEPARTMENT_OTHER): Admission: EM | Admit: 2024-05-27 | Discharge: 2024-05-27 | Disposition: A | Discharge status: Home / Self Care

## 2024-05-27 DIAGNOSIS — I10 Essential (primary) hypertension: Secondary | ICD-10-CM | POA: Insufficient documentation

## 2024-05-27 DIAGNOSIS — J449 Chronic obstructive pulmonary disease, unspecified: Secondary | ICD-10-CM | POA: Insufficient documentation

## 2024-05-27 DIAGNOSIS — Z79899 Other long term (current) drug therapy: Secondary | ICD-10-CM | POA: Insufficient documentation

## 2024-05-27 DIAGNOSIS — I2510 Atherosclerotic heart disease of native coronary artery without angina pectoris: Secondary | ICD-10-CM | POA: Diagnosis not present

## 2024-05-27 DIAGNOSIS — Z7982 Long term (current) use of aspirin: Secondary | ICD-10-CM | POA: Insufficient documentation

## 2024-05-27 DIAGNOSIS — M546 Pain in thoracic spine: Secondary | ICD-10-CM | POA: Diagnosis present

## 2024-05-27 LAB — CBC
HCT: 36.4 % (ref 36.0–46.0)
Hemoglobin: 11.7 g/dL — ABNORMAL LOW (ref 12.0–15.0)
MCH: 27.8 pg (ref 26.0–34.0)
MCHC: 32.1 g/dL (ref 30.0–36.0)
MCV: 86.5 fL (ref 80.0–100.0)
Platelets: 226 K/uL (ref 150–400)
RBC: 4.21 MIL/uL (ref 3.87–5.11)
RDW: 14.5 % (ref 11.5–15.5)
WBC: 5.8 K/uL (ref 4.0–10.5)
nRBC: 0 % (ref 0.0–0.2)

## 2024-05-27 LAB — BASIC METABOLIC PANEL WITH GFR
Anion gap: 14 (ref 5–15)
BUN: 13 mg/dL (ref 8–23)
CO2: 24 mmol/L (ref 22–32)
Calcium: 9.3 mg/dL (ref 8.9–10.3)
Chloride: 106 mmol/L (ref 98–111)
Creatinine, Ser: 0.73 mg/dL (ref 0.44–1.00)
GFR, Estimated: >60 mL/min (ref >60)
Glucose, Bld: 110 mg/dL — ABNORMAL HIGH (ref 70–99)
Potassium: 4 mmol/L (ref 3.5–5.1)
Sodium: 144 mmol/L (ref 135–145)

## 2024-05-27 LAB — TROPONIN T, HIGH SENSITIVITY: Troponin T High Sensitivity: <15 ng/L (ref 0–19)

## 2024-05-27 MED ORDER — METHOCARBAMOL 500 MG PO TABS: 500.0000 mg | ORAL_TABLET | Freq: Every day | ORAL | 0 refills | Status: AC | PRN

## 2024-05-27 MED ORDER — LIDOCAINE 5 % EX PTCH
1.0000 | MEDICATED_PATCH | Freq: Once | CUTANEOUS | Status: DC
  Administered 2024-05-27: 1 via TRANSDERMAL
  Filled 2024-05-27: qty 1

## 2024-05-27 MED ORDER — LIDOCAINE 5 % EX PTCH: 1.0000 | MEDICATED_PATCH | CUTANEOUS | 0 refills | Status: AC

## 2024-05-27 MED ORDER — ACETAMINOPHEN 325 MG PO TABS
650.0000 mg | ORAL_TABLET | Freq: Once | ORAL | Status: AC
  Administered 2024-05-27: 650 mg via ORAL
  Filled 2024-05-27: qty 2

## 2024-05-27 MED ORDER — METHOCARBAMOL 500 MG PO TABS
500.0000 mg | ORAL_TABLET | Freq: Once | ORAL | Status: AC
  Administered 2024-05-27: 500 mg via ORAL
  Filled 2024-05-27: qty 1

## 2024-05-27 MED ORDER — IOHEXOL 350 MG/ML SOLN
75.0000 mL | Freq: Once | INTRAVENOUS | Status: AC | PRN
  Administered 2024-05-27: 75 mL via INTRAVENOUS

## 2024-05-27 NOTE — ED Provider Notes (Signed)
 [facility] Provider Note   CSN: [id number] Arrival date & time: 05/27/24  1640     Patient presents with: Back Pain   Karen Woods is a 78 y.o. female.   This is a 78 year old female presenting emergency department with left upper back pain.  Near her left shoulder blade.  X 3 days.  Intermittent spasming type pain.  No overt trauma.  Pain radiates around under her left breast.  Shortness of breath secondary to not being able to take deep breath.  Prior hardware/fusion of thoracic spine per her report.   Back Pain      Prior to Admission medications   Medication Sig Start Date End Date Taking? Authorizing Provider  lidocaine  (LIDODERM ) 5 % Place 1 patch onto the skin daily. Remove & Discard patch within 12 hours or as directed by MD 05/27/24  Yes Neysa Caron PARAS, DO  methocarbamol  (ROBAXIN ) 500 MG tablet Take 1 tablet (500 mg total) by mouth daily as needed for muscle spasms. 05/27/24  Yes Neysa Caron PARAS, DO  albuterol  (PROVENTIL ) (5 MG/ML) 0.5% nebulizer solution Take 2.5 mg by nebulization every 6 (six) hours as needed for wheezing or shortness of breath.    [provider]  albuterol  (VENTOLIN  HFA) 108 (90 Base) MCG/ACT inhaler Inhale 1-2 puffs into the lungs every 6 (six) hours as needed for wheezing or shortness of breath.    [provider]  aspirin  EC 81 MG tablet Take 81 mg by mouth daily. Swallow whole.    [provider]  Calcium  Carb-Cholecalciferol (CALCIUM  + D3 PO) Take 1 tablet by mouth 2 (two) times daily.    [provider]  cycloSPORINE  (RESTASIS ) 0.05 % ophthalmic emulsion Place 1 drop into both eyes 2 (two) times daily.    [provider]  estradiol (ESTRACE) 0.5 MG tablet Take 0.5 mg by mouth daily.    [provider]  gabapentin  (NEURONTIN ) 800 MG tablet Take 800 mg by mouth 3 (three) times daily.     [provider]  HYDROcodone -acetaminophen  (NORCO) 10-325  MG tablet Take 1 tablet by mouth 3 (three) times daily.    [provider]  metoprolol  tartrate (LOPRESSOR ) 25 MG tablet Take 25 mg by mouth 2 (two) times daily.    [provider]  Multiple Vitamins-Minerals (MULTIVITAMIN WITH MINERALS) tablet Take 1 tablet by mouth daily.    [provider]  omeprazole (PRILOSEC) 40 MG capsule Take 40 mg by mouth daily.    [provider]  predniSONE (DELTASONE) 5 MG tablet Take 5 mg by mouth 2 (two) times daily.    [provider]  rosuvastatin  (CRESTOR ) 40 MG tablet Take 40 mg by mouth daily.    [provider]  SYMBICORT 160-4.5 MCG/ACT inhaler Inhale 2 puffs into the lungs 2 (two) times daily as needed (shortness of breath). 11/04/20   [provider]    Allergies: Sulfamethoxazole, Morphine and codeine, Oxycodone , and Codeine    Review of Systems  Musculoskeletal:  Positive for back pain.    Updated Vital Signs BP (!) 168/82 (BP Location: Left Arm)   Pulse 85   Temp 98.1 F (36.7 C) (Oral)   Resp 20   SpO2 100%   Physical Exam Vitals reviewed.  HENT:     Head: Normocephalic.     Nose: Nose normal.     Mouth/Throat:     Mouth: Mucous membranes are moist.  Eyes:     Conjunctiva/sclera: Conjunctivae  normal.  Cardiovascular:     Rate and Rhythm: Normal rate and regular rhythm.  Pulmonary:     Effort: Pulmonary effort is normal.     Breath sounds: Normal breath sounds.  Abdominal:     General: Abdomen is flat. There is no distension.     Palpations: Abdomen is soft.     Tenderness: There is no abdominal tenderness. There is no guarding or rebound.  Musculoskeletal:     Comments: No midline spinal tenderness.  Some tenderness and hypertonicity to the trapezius musculature.  Skin:    General: Skin is warm and dry.     Capillary Refill: Capillary refill takes less than 2 seconds.  Neurological:     Mental Status: She is alert and oriented to person, place, and time.   Psychiatric:        Mood and Affect: Mood normal.        Behavior: Behavior normal.     (all labs ordered are listed, but only abnormal results are displayed) Labs Reviewed  BASIC METABOLIC PANEL WITH GFR - Abnormal; Notable for the following components:      Result Value   Glucose, Bld 110 (*)    All other components within normal limits  CBC - Abnormal; Notable for the following components:   Hemoglobin 11.7 (*)    All other components within normal limits  TROPONIN T, HIGH SENSITIVITY    EKG: EKG Interpretation Date/Time:  Saturday May 27 2024 16:55:24 EDT Ventricular Rate:  79 PR Interval:  145 QRS Duration:  90 QT Interval:  383 QTC Calculation: 439 R Axis:   52  Text Interpretation: Sinus rhythm Probable left atrial enlargement Confirmed by Neysa Clap [phone]) on 05/27/2024 4:57:25 PM  Radiology: CT Angio Chest PE W and/or Wo Contrast Result Date: 05/27/2024 CLINICAL DATA:  Bilateral interscapular pain and rib pain for 3 days, short of breath EXAM: CT ANGIOGRAPHY CHEST WITH CONTRAST TECHNIQUE: Multidetector CT imaging of the chest was performed using the standard protocol during bolus administration of intravenous contrast. Multiplanar CT image reconstructions and MIPs were obtained to evaluate the vascular anatomy. RADIATION DOSE REDUCTION: This exam was performed according to the departmental dose-optimization program which includes automated exposure control, adjustment of the mA and/or kV according to patient size and/or use of iterative reconstruction technique. CONTRAST:  75mL OMNIPAQUE  IOHEXOL  350 MG/ML SOLN COMPARISON:  05/27/2024, 12/18/2022 FINDINGS: Cardiovascular: This is a technically adequate evaluation of the pulmonary vasculature. No filling defects or pulmonary emboli. Mild cardiomegaly without pericardial effusion. Postsurgical changes from median sternotomy and aortic valve replacement. Stable 4 cm ascending thoracic aortic aneurysm, with no evidence of  dissection. Atherosclerosis of the aorta and native coronary vasculature. Mediastinum/Nodes: No enlarged mediastinal, hilar, or axillary lymph nodes. Thyroid gland, trachea, and esophagus demonstrate no significant findings. Lungs/Pleura: No acute airspace disease, effusion, or pneumothorax. Stable upper lobe predominant emphysema. The central airways are patent. Stable 4 mm left lower lobe pulmonary nodule reference image 76/12, no specific follow-up recommended based on size and long-term stability. Upper Abdomen: No acute abnormality. Musculoskeletal: Postsurgical changes from median sternotomy, thoracolumbar fusion, and right shoulder arthroplasty. There are no acute or destructive bony abnormalities. Prominent spondylosis within the upper thoracic spine, greatest at the T3-4 through T5-6 levels. Review of the MIP images confirms the above findings. IMPRESSION: 1. No evidence of pulmonary embolus. 2. No acute intrathoracic process. 3. Stable 4 cm ascending thoracic aortic aneurysm. Recommend annual imaging followup by CTA or MRA. This recommendation follows 2010 ACCF/AHA/AATS/ACR/ASA/SCA/SCAI/SIR/STS/SVM Guidelines  for the Diagnosis and Management of Patients with Thoracic Aortic Disease. Circulation. 2010; 121: Z733-z630. Aortic aneurysm NOS (ICD10-I71.9) 4. Aortic Atherosclerosis (ICD10-I70.0) and Emphysema (ICD10-J43.9). Electronically Signed   By: Ozell Daring M.D.   On: 05/27/2024 18:33   DG Chest 2 View Result Date: 05/27/2024 CLINICAL DATA:  Shortness of breath. EXAM: CHEST - 2 VIEW COMPARISON:  07/31/2023. FINDINGS: The heart size and mediastinal contours are within normal limits. There is atherosclerotic calcification of the aorta. No consolidation, effusion, or pneumothorax is seen. Sternotomy wires are noted and cardiac valve repair changes are present. Spinal fusion hardware is seen in the thoracolumbar spine. Right shoulder arthroplasty changes are present. IMPRESSION: No active cardiopulmonary  disease. Electronically Signed   By: Leita Birmingham M.D.   On: 05/27/2024 18:00     Procedures   Medications Ordered in the ED  lidocaine  (LIDODERM ) 5 % 1 patch (1 patch Transdermal Patch Applied 05/27/24 1725)  acetaminophen  (TYLENOL ) tablet 650 mg (650 mg Oral Given 05/27/24 1725)  methocarbamol  (ROBAXIN ) tablet 500 mg (500 mg Oral Given 05/27/24 1725)  iohexol  (OMNIPAQUE ) 350 MG/ML injection 75 mL (75 mLs Intravenous Contrast Given 05/27/24 1805)    Clinical Course as of 05/27/24 2015  Sat May 27, 2024  1658 Normal EF on echo in march 2025.  [TY]    Clinical Course User Index [TY] Neysa Caron PARAS, DO                                 Medical Decision Making This is a 78 year old female presenting emergency department with back pain in her upper thoracic spine.  She is afebrile nontachycardic, slightly hypertensive.  Complex past medical history to include CAD aortic stenosis hypertension hyperlipidemia, COPD, she is status post aortic valve replacement.  She notes that she recently had surgery on her left wrist.  Given her complaint of left-sided chest pain and shortness of breath concern for possible PE.  However CTA negative.  Did reveal 4 cm thoracic aortic aneurysm.  However, based off physical exam and history patient's pain seemingly musculoskeletal, reproducible with palpation to the musculature of the left spine.  Worsened with movement.  ACS unlikely given duration of symptoms with negative troponin and reassuring EKG.  No fever or tachycardia or leukocytosis to suggest infectious process.  No significant metabolic derangements.  Was treated with lidocaine  patch and muscle relaxer with great improvement of her symptoms.  Will discharge in stable condition.  Amount and/or Complexity of Data Reviewed External Data Reviewed:     Details: See ED course Labs: ordered. Decision-making details documented in ED Course.    Details: See above Radiology: ordered and independent interpretation  performed.    Details: Do not appreciate obvious pneumothorax on chest x-ray ECG/medicine tests: independent interpretation performed.    Details: No STEMI  Risk OTC drugs. Prescription drug management. Decision regarding hospitalization. Diagnosis or treatment significantly limited by social determinants of health. Risk Details: Poor health literacy      Final diagnoses:  Acute left-sided thoracic back pain    ED Discharge Orders          Ordered    lidocaine  (LIDODERM ) 5 %  Every 24 hours        05/27/24 1932    methocarbamol  (ROBAXIN ) 500 MG tablet  Daily PRN        05/27/24 1932  Neysa Caron PARAS, DO 05/27/24 2015

## 2024-05-27 NOTE — ED Notes (Signed)
 Patient transported to CT

## 2024-05-27 NOTE — Discharge Instructions (Signed)
 Please follow-up with your primary doctor who is prescribing your pain medications for further pain relief options.  Return for fevers, chills, chest pain, shortness of breath, lightheadedness, palpitations, passout or develop any new or worsening symptoms that are concerning to you.  As discussed you have a thoracic aneurysm that will need yearly surveillance with imaging.  Please discuss this with your primary doctor.

## 2024-05-27 NOTE — ED Triage Notes (Signed)
 Reports BIL shoulder/shoulder blades and side of ribs pain for 3 days. States feels SHOB when deep breathing.  Denies known injury  Denies chest pain, NV  Ambulatory

## 2024-09-28 NOTE — Progress Notes (Signed)
 HPI: Mrs Dittman returns for follow-up visit.  Last patient was seen in our clinic 2 months ago.  We have been following patient for several years for labile blood pressure with today frequent evening and nighttime spikes in the blood pressure associated with daytime fatigue.  Importantly patient mentioned that during this episodes she reported diarrhea, hives, clammy sensation and being nauseated.  Typically we advised patient to take clonidine 0.1 mg which contributed to resolution of her symptoms.  Typically during the clinic visit patient blood pressure stays relatively normal.  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  Also importantly on December 4 patient was followed with a neurologist Damien, MD.  Notes reviewed.  Patient with remote history of stroke Willis.  Patient had impression that patient may have autonomic attacks.  Since last visit patient reported no ED visits or hospitalizations.  Patient's episodes continue being infrequent.  Open patient mentioned when I have episodes I checked my blood pressure typically stays between 180 and 119.  Patient also mentioned that during the daytime my balance often is off.  Patient had 1 episode when she fell down the steps when she woke up without ability to hold the rails.  Patient had no injury.  After prolonged sitting patient legs get tired.  Patient typically feeling fatigued later in the afternoon early in the evening.  Patient with typical symptoms of chronic venous insufficiency.  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  Since last visit patient had a 24 ambulatory blood pressure monitor performed.  That was the first test ever.  Patient had evidence of low-normal blood pressure readings with normal heart rate and minimal diurnal variation.  No spike in the blood pressure was observed.  Most of the daytime blood pressure readings are less than 120 systolically with a normal heart  rate.  Patient had no diurnal blood pressure variations.  Morning blood pressure readings were normal.  The findings were shared with the patient.  This test provided very important insight into patient's symptoms and medical condition.  ROS:  All symptoms were briefly reviewed again and as mentioned above and previously found to be without changes.  Patient reported no excessive thirst.  When asked about fluid intake patient denies consuming no more than 40 ounces including predominantly 2 large glasses of tea.  Patient also did take 1 glass of wine at bedtime.  Patient reports no chest pain no shortness of breath at rest or exertion.  No postprandial fatigue.  Neurologic: Intact.  No changes rest of review of system.  You  MEDICATIONS:  List of medications reviewed.  Patient on amitriptyline 10 mg daily, coated aspirin  81 mg daily Lyrica 225 mg twice daily, Crestor  40 mg at bedtime.  No blood pressure lowering medications except for clonidine 0.1 mg as needed episodes with blood pressure greater than 180.  No new side effects or allergies reported.  PE: On examination very pleasant, no distress appearing looking well and comfortable.  Weight 154 pounds  (Down 5 pounds).  Old saturation 97%.  Temperature 97.7 F.  Heart rate 71 regular sitting, 68 standing.  Blood pressure with regular cuff left in sitting 135/86, standing 132/72.  Neck: Supple, no JVD, no carotid disclaimed bruits bilaterally.  Lungs: Clear to station, no crackles or rails.  Heart: Normal heart sounds no heart murmurs.  Legs: While sitting patient had light purple feet.  Patient throughout entire encounter moved her feet.  When patient  stood up after sat for 5 minutes, patient felt dizzy for at least 15 seconds.  This is my balance is not good.  After 15 to 20 seconds patient was able to regain the balance and after short distance of walking in examination room the color of the feet became absolutely normal.  Neurologic nation was  normal.  No changes rest of examination.  IMPRESSION AND PLAN: Ms. Orengo returns for follow-up visit.  Patient is known to our clinic from multiple previous evaluation.  Recently patient had a 24-hour blood pressure monitor which demonstrated normal and often low normal blood pressure readings during the daytime.  Patient heart rate is normal.  Patient with clinical symptoms and signs of mild dysautonomia.  I believe that in part is driven by relatively low blood pressure, inadequate fluid intake and pooling of the venous blood in the lower extremities.  Therefore I advised patient to increase her fluid intake including adding sugar-free propel or Pedialyte.  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  Also discussed with the patient that she should be talk to her PCP to reduce dose of Lyrica.  Patient is on very high dose of Lyrica.  Today I spent more than 40 minutes [place] 30 minutes in direct contact with the patient.  Questions were answered.  Had lengthy discussion with the patient.  The findings of 24 ambulatory blood pressure monitor were reviewed.  Instruction were given.  Will follow patient back in 3 months or as needed.

## 2024-10-07 ENCOUNTER — Encounter (HOSPITAL_BASED_OUTPATIENT_CLINIC_OR_DEPARTMENT_OTHER): Payer: Self-pay

## 2024-10-07 ENCOUNTER — Other Ambulatory Visit: Payer: Self-pay

## 2024-10-07 ENCOUNTER — Emergency Department (HOSPITAL_BASED_OUTPATIENT_CLINIC_OR_DEPARTMENT_OTHER)
Admission: EM | Admit: 2024-10-07 | Discharge: 2024-10-07 | Disposition: A | Discharge status: Home / Self Care | Attending: Emergency Medicine | Admitting: Emergency Medicine

## 2024-10-07 ENCOUNTER — Emergency Department (HOSPITAL_BASED_OUTPATIENT_CLINIC_OR_DEPARTMENT_OTHER)

## 2024-10-07 DIAGNOSIS — I10 Essential (primary) hypertension: Secondary | ICD-10-CM | POA: Insufficient documentation

## 2024-10-07 DIAGNOSIS — J449 Chronic obstructive pulmonary disease, unspecified: Secondary | ICD-10-CM | POA: Insufficient documentation

## 2024-10-07 DIAGNOSIS — Z7982 Long term (current) use of aspirin: Secondary | ICD-10-CM | POA: Diagnosis not present

## 2024-10-07 DIAGNOSIS — Z79899 Other long term (current) drug therapy: Secondary | ICD-10-CM | POA: Insufficient documentation

## 2024-10-07 DIAGNOSIS — R739 Hyperglycemia, unspecified: Secondary | ICD-10-CM | POA: Diagnosis not present

## 2024-10-07 DIAGNOSIS — R110 Nausea: Secondary | ICD-10-CM | POA: Diagnosis present

## 2024-10-07 DIAGNOSIS — N3001 Acute cystitis with hematuria: Secondary | ICD-10-CM | POA: Diagnosis not present

## 2024-10-07 LAB — URINALYSIS, ROUTINE W REFLEX MICROSCOPIC
Bilirubin Urine: NEGATIVE
Glucose, UA: NEGATIVE mg/dL
Ketones, ur: NEGATIVE mg/dL
Nitrite: POSITIVE — AB
Protein, ur: NEGATIVE mg/dL
Specific Gravity, Urine: 1.015 (ref 1.005–1.030)
pH: 7 (ref 5.0–8.0)

## 2024-10-07 LAB — CBC WITH DIFFERENTIAL/PLATELET
Abs Immature Granulocytes: 0.03 K/uL (ref 0.00–0.07)
Basophils Absolute: 0 K/uL (ref 0.0–0.1)
Basophils Relative: 1 %
Eosinophils Absolute: 0.1 K/uL (ref 0.0–0.5)
Eosinophils Relative: 2 %
HCT: 36.8 % (ref 36.0–46.0)
Hemoglobin: 12.1 g/dL (ref 12.0–15.0)
Immature Granulocytes: 0 %
Lymphocytes Relative: 12 %
Lymphs Abs: 0.8 K/uL (ref 0.7–4.0)
MCH: 28.5 pg (ref 26.0–34.0)
MCHC: 32.9 g/dL (ref 30.0–36.0)
MCV: 86.6 fL (ref 80.0–100.0)
Monocytes Absolute: 0.5 K/uL (ref 0.1–1.0)
Monocytes Relative: 7 %
Neutro Abs: 5.2 K/uL (ref 1.7–7.7)
Neutrophils Relative %: 78 %
Platelets: 254 K/uL (ref 150–400)
RBC: 4.25 MIL/uL (ref 3.87–5.11)
RDW: 14.1 % (ref 11.5–15.5)
WBC: 6.7 K/uL (ref 4.0–10.5)
nRBC: 0 % (ref 0.0–0.2)

## 2024-10-07 LAB — COMPREHENSIVE METABOLIC PANEL WITH GFR
ALT: 14 U/L (ref 0–44)
AST: 17 U/L (ref 15–41)
Albumin: 4.3 g/dL (ref 3.5–5.0)
Alkaline Phosphatase: 75 U/L (ref 38–126)
Anion gap: 11 (ref 5–15)
BUN: 13 mg/dL (ref 8–23)
CO2: 25 mmol/L (ref 22–32)
Calcium: 9.9 mg/dL (ref 8.9–10.3)
Chloride: 103 mmol/L (ref 98–111)
Creatinine, Ser: 0.74 mg/dL (ref 0.44–1.00)
GFR, Estimated: >60 mL/min
Glucose, Bld: 119 mg/dL — ABNORMAL HIGH (ref 70–99)
Potassium: 4.2 mmol/L (ref 3.5–5.1)
Sodium: 139 mmol/L (ref 135–145)
Total Bilirubin: 1.1 mg/dL (ref 0.0–1.2)
Total Protein: 7.1 g/dL (ref 6.5–8.1)

## 2024-10-07 LAB — URINALYSIS, MICROSCOPIC (REFLEX)

## 2024-10-07 LAB — LIPASE, BLOOD: Lipase: 14 U/L (ref 11–51)

## 2024-10-07 MED ORDER — ONDANSETRON HCL 4 MG/2ML IJ SOLN
4.0000 mg | Freq: Once | INTRAMUSCULAR | Status: AC
  Administered 2024-10-07: 4 mg via INTRAVENOUS
  Filled 2024-10-07: qty 2

## 2024-10-07 MED ORDER — ONDANSETRON 4 MG PO TBDP: 4.0000 mg | ORAL_TABLET | Freq: Three times a day (TID) | ORAL | 0 refills | Status: AC | PRN

## 2024-10-07 MED ORDER — CEPHALEXIN 500 MG PO CAPS: 500.0000 mg | ORAL_CAPSULE | Freq: Three times a day (TID) | ORAL | 0 refills | Status: AC

## 2024-10-07 MED ORDER — IOHEXOL 300 MG/ML  SOLN
80.0000 mL | Freq: Once | INTRAMUSCULAR | Status: AC | PRN
  Administered 2024-10-07: 80 mL via INTRAVENOUS

## 2024-10-07 MED ORDER — SODIUM CHLORIDE 0.9 % IV SOLN
1.0000 g | Freq: Once | INTRAVENOUS | Status: AC
  Administered 2024-10-07: 1 g via INTRAVENOUS
  Filled 2024-10-07: qty 10

## 2024-10-07 NOTE — ED Notes (Signed)
 Patient transferred from waiting room to ED treatment room. Assuming pt care at this time.

## 2024-10-07 NOTE — ED Notes (Signed)
 Called lab to run urine culture off of urine sample sent to lab previously. Per lab, they will put this into process now.

## 2024-10-07 NOTE — ED Notes (Signed)
Patient transported to imaging at this time.

## 2024-10-07 NOTE — ED Provider Notes (Signed)
 " [place] [facility] Provider Note   CSN: [id number] Arrival date & time: 10/07/24  1644     Patient presents with: Nausea   Karen Woods is a 79 y.o. female with a past medical history significant for hyperlipidemia, COPD, depression, pulmonary hypertension, GERD, and hypertension who presents to the ED due to persistent nausea that started yesterday.  Denies emesis and diarrhea.  Denies chest pain and shortness of breath.  Denies associated abdominal pain.  Denies fever and chills.  Patient also concerned about her elevated blood pressure.  Notes for the past year her blood pressure has been spiking at night.  Typically gets in the 180s however, has reached above 200 systolic.  Denies associated headache, visual changes, chest pain, shortness of breath while BP is elevated.  Is currently following cardiology.  Is not on any daily BP medications.  Takes clonidine as needed for elevated blood pressure readings.  Patient has an appointment with cardiology on Wednesday.  History obtained from patient and past medical records. No interpreter used during encounter.      Prior to Admission medications  Medication Sig Start Date End Date Taking? Authorizing Provider  cephALEXin  (KEFLEX ) 500 MG capsule Take 1 capsule (500 mg total) by mouth 3 (three) times daily for 5 days. 10/07/24 10/12/24 Yes Keirstyn Aydt, Aleck BROCKS, PA-C  ondansetron  (ZOFRAN -ODT) 4 MG disintegrating tablet Take 1 tablet (4 mg total) by mouth every 8 (eight) hours as needed for nausea or vomiting. 10/07/24  Yes Philippe Gang C, PA-C  albuterol  (PROVENTIL ) (5 MG/ML) 0.5% nebulizer solution Take 2.5 mg by nebulization every 6 (six) hours as needed for wheezing or shortness of breath.    [provider]  albuterol  (VENTOLIN  HFA) 108 (90 Base) MCG/ACT inhaler Inhale 1-2 puffs into the lungs every 6 (six) hours as needed for wheezing or shortness of breath.    [provider]   aspirin  EC 81 MG tablet Take 81 mg by mouth daily. Swallow whole.    [provider]  Calcium  Carb-Cholecalciferol (CALCIUM  + D3 PO) Take 1 tablet by mouth 2 (two) times daily.    [provider]  cycloSPORINE  (RESTASIS ) 0.05 % ophthalmic emulsion Place 1 drop into both eyes 2 (two) times daily.    [provider]  estradiol (ESTRACE) 0.5 MG tablet Take 0.5 mg by mouth daily.    [provider]  gabapentin  (NEURONTIN ) 800 MG tablet Take 800 mg by mouth 3 (three) times daily.     [provider]  HYDROcodone -acetaminophen  (NORCO) 10-325 MG tablet Take 1 tablet by mouth 3 (three) times daily.    [provider]  lidocaine  (LIDODERM ) 5 % Place 1 patch onto the skin daily. Remove & Discard patch within 12 hours or as directed by MD 05/27/24   Neysa Caron PARAS, DO  methocarbamol  (ROBAXIN ) 500 MG tablet Take 1 tablet (500 mg total) by mouth daily as needed for muscle spasms. 05/27/24   Neysa Caron PARAS, DO  metoprolol  tartrate (LOPRESSOR ) 25 MG tablet Take 25 mg by mouth 2 (two) times daily.    [provider]  Multiple Vitamins-Minerals (MULTIVITAMIN WITH MINERALS) tablet Take 1 tablet by mouth daily.    [provider]  omeprazole (PRILOSEC) 40 MG capsule Take 40 mg by mouth daily.    [provider]  predniSONE (DELTASONE) 5 MG tablet Take 5 mg by mouth 2 (two) times daily.    [provider]  rosuvastatin  (CRESTOR ) 40 MG tablet Take  40 mg by mouth daily.    [provider]  SYMBICORT 160-4.5 MCG/ACT inhaler Inhale 2 puffs into the lungs 2 (two) times daily as needed (shortness of breath). 11/04/20   [provider]    Allergies: Sulfamethoxazole, Morphine and codeine, Oxycodone , and Codeine    Review of Systems  Respiratory:  Negative for shortness of breath.   Cardiovascular:  Negative for chest pain.  Gastrointestinal:  Positive for nausea. Negative for diarrhea and vomiting.    Updated  Vital Signs BP 138/66 (BP Location: Right Arm)   Pulse 61   Temp 98.3 F (36.8 C) (Oral)   Resp 16   Ht 5' 4 (1.626 m)   Wt 68.9 kg   SpO2 96%   BMI 26.09 kg/m   Physical Exam Vitals and nursing note reviewed.  Constitutional:      General: She is not in acute distress.    Appearance: She is not ill-appearing.  HENT:     Head: Normocephalic.  Eyes:     Pupils: Pupils are equal, round, and reactive to light.  Cardiovascular:     Rate and Rhythm: Normal rate and regular rhythm.     Pulses: Normal pulses.     Heart sounds: Normal heart sounds. No murmur heard.    No friction rub. No gallop.  Pulmonary:     Effort: Pulmonary effort is normal.     Breath sounds: Normal breath sounds.  Abdominal:     General: Abdomen is flat. There is no distension.     Palpations: Abdomen is soft.     Tenderness: There is abdominal tenderness. There is no guarding or rebound.     Comments: Diffusely tender without rebound or guarding.   Musculoskeletal:        General: Normal range of motion.     Cervical back: Neck supple.     Comments: No lower extremity edema.  Skin:    General: Skin is warm and dry.  Neurological:     General: No focal deficit present.     Mental Status: She is alert.  Psychiatric:        Mood and Affect: Mood normal.        Behavior: Behavior normal.     (all labs ordered are listed, but only abnormal results are displayed) Labs Reviewed  COMPREHENSIVE METABOLIC PANEL WITH GFR - Abnormal; Notable for the following components:      Result Value   Glucose, Bld 119 (*)    All other components within normal limits  URINALYSIS, ROUTINE W REFLEX MICROSCOPIC - Abnormal; Notable for the following components:   Hgb urine dipstick TRACE (*)    Nitrite POSITIVE (*)    Leukocytes,Ua SMALL (*)    All other components within normal limits  URINALYSIS, MICROSCOPIC (REFLEX) - Abnormal; Notable for the following components:   Bacteria, UA MANY (*)    All other components  within normal limits  URINE CULTURE  LIPASE, BLOOD  CBC WITH DIFFERENTIAL/PLATELET    EKG: None  Radiology: CT ABDOMEN PELVIS W CONTRAST Result Date: 10/07/2024 EXAM: CT ABDOMEN AND PELVIS WITH CONTRAST 10/07/2024 06:23:12 PM TECHNIQUE: CT of the abdomen and pelvis was performed with the administration of 80 mL of iohexol  (OMNIPAQUE ) 300 MG/ML solution. Multiplanar reformatted images are provided for review. Automated exposure control, iterative reconstruction, and/or weight-based adjustment of the mA/kV was utilized to reduce the radiation dose to as low as reasonably achievable. COMPARISON: 12/30/2022. CLINICAL HISTORY: upper abdominal pain FINDINGS: LOWER CHEST: No acute  abnormality. LIVER: The liver is unremarkable. GALLBLADDER AND BILE DUCTS: Gallbladder is unremarkable. No biliary ductal dilatation. SPLEEN: No acute abnormality. PANCREAS: Pancreatic calcifications suggestive of chronic pancreatitis. ADRENAL GLANDS: No acute abnormality. KIDNEYS, URETERS AND BLADDER: No stones in the kidneys or ureters. No hydronephrosis. No perinephric or periureteral stranding. Urinary bladder is unremarkable. GI AND BOWEL: Stomach demonstrates no acute abnormality. Colonic diverticulosis. There is no bowel obstruction. PERITONEUM AND RETROPERITONEUM: No ascites. No free air. VASCULATURE: Aorta is normal in caliber. Atherosclerotic plaque of the aorta and its branches. LYMPH NODES: No lymphadenopathy. REPRODUCTIVE ORGANS: Status post hysterectomy. BONES AND SOFT TISSUES: Bilateral total hip arthroplasties noted. Thoracolumbar posterior fixation noted. Tiny fat containing umbilical hernia. No acute osseous abnormality. No focal soft tissue abnormality. IMPRESSION: 1. No acute findings in the abdomen or pelvis. Electronically signed by: Pinkie Pebbles MD 10/07/2024 06:47 PM EST RP Workstation: [id number]     Procedures   Medications Ordered in the ED  cefTRIAXone  (ROCEPHIN ) 1 g in sodium chloride  0.9 % 100  mL IVPB (1 g Intravenous New Bag/Given 10/07/24 1836)  ondansetron  (ZOFRAN ) injection 4 mg (4 mg Intravenous Given 10/07/24 1727)  iohexol  (OMNIPAQUE ) 300 MG/ML solution 80 mL (80 mLs Intravenous Contrast Given 10/07/24 1814)    Clinical Course as of 10/07/24 1857  Sat Oct 07, 2024  [address]): SMALL [CA]  1802 Nitrite(!): POSITIVE [CA]  1802 Bacteria, UA(!): MANY [CA]    Clinical Course User Index [CA] Lorelle Aleck BROCKS, PA-C                                 Medical Decision Making Amount and/or Complexity of Data Reviewed Independent Historian: caregiver    Details: Daughter at bedside provided history Labs: ordered. Decision-making details documented in ED Course. Radiology: ordered and independent interpretation performed. Decision-making details documented in ED Course. ECG/medicine tests: ordered and independent interpretation performed. Decision-making details documented in ED Course.  Risk Prescription drug management.   This patient presents to the ED for concern of Nausea, this involves an extensive number of treatment options, and is a complaint that carries with it a high risk of complications and morbidity.  The differential diagnosis includes acute cholecystitis, pancreatitis, ACS, UTI, etc  79 year old female presents to the ED due to persistent nausea that started yesterday.  No emesis or diarrhea.  Denies chest pain.  Patient also concerned about elevated blood pressure readings at night that has been ongoing for the past year.  Currently follows cardiology.  Patient notes BP typically gets in the 180s and has gotten above 200 systolic at night.  Has clonidine PRN for elevated blood pressure.  Upon arrival patient afebrile, not tachycardic or hypoxic.  BP 163/76.  Patient well-appearing on exam.  Abdomen soft, nondistended with diffuse tenderness.  No rebound or guarding.  Routine labs ordered.  IV Zofran  given.  CT abdomen given tenderness on exam.  EKG to  rule out atypical ACS.  Reviewed internal medicine note from 09/27/2024.  It is believed that patient's labile blood pressure is related to mild dysautonomia.  It was thought that this was likely related to low blood pressure, inadequate fluid intake, and pooling of venous blood in lower extremities.  Daughter notes that she ordered patient compression stockings.  CBC with no leukocytosis.  Normal hemoglobin.  CMP reassuring.  Normal renal function.  No major electrolyte derangements.  Hyperglycemia 119.  Normal anion gap.  Lipase normal.  Low suspicion  for pancreatitis.  Normal LFTs.  UA with nitrites, leukocytes, many bacteria.  Patient denies any dysuria.  No fever or chills.  No back or flank pain.  IV Rocephin  given for acute cystitis. EKG NSR, no signs of acute ischemia. Low suspicion for atypical ACS.  CT abdomen personally reviewed and interpreted which is negative for any acute abnormalities.  Unclear what is causing patient's persistent nausea. Patient able to tolerate po at bedside without difficulty.   BP initially elevated upon arrival at 163/76 however, upon recheck BP 138/66.  Unclear what is causing patient's elevated BP spikes however, patient currently being worked up in the outpatient setting.  Patient currently asymptomatic.  Low suspicion for hypertensive urgency/emergency.  Patient discharged with Keflex  for acute cysitis and Zofran .  Patient has no CVA tenderness on exam.  No fever or chills.  Low suspicion for pyelonephritis.  Patient has an appointment scheduled for Wednesday with PCP.  Patient stable for discharge. Strict ED precautions discussed with patient. Patient states understanding and agrees to plan. Patient discharged home in no acute distress and stable vitals  Discussed with Dr. Lenor who evaluated patient at bedside and agrees with assessment and plan.   Co morbidities that complicate the patient evaluation  Hx HTN Cardiac Monitoring: / EKG:  The patient was  maintained on a cardiac monitor.  I personally viewed and interpreted the cardiac monitored which showed an underlying rhythm of: NSR, HR 59  Social Determinants of Health:  Elderly >65  Test / Admission - Considered:  Considered admission however, patient's workup reassuring.  UA positive for acute cystitis.  Patient discharged with Keflex .  Able to tolerate p.o. at bedside.  Low suspicion for any emergent etiologies of nausea.  BP improved while in the ED.     Final diagnoses:  Nausea  Acute cystitis with hematuria    ED Discharge Orders          Ordered    cephALEXin  (KEFLEX ) 500 MG capsule  3 times daily        10/07/24 1807    ondansetron  (ZOFRAN -ODT) 4 MG disintegrating tablet  Every 8 hours PRN        10/07/24 1807               Yeilyn Gent C, PA-C 10/07/24 1857    Lenor Hollering, MD 10/07/24 2315  "

## 2024-10-07 NOTE — ED Notes (Signed)
 Educated patient on proper procedure for obtaining a clean catch urine specimen. Patient verbalizes understanding. Patient aware of necessity of urine sample.

## 2024-10-07 NOTE — ED Triage Notes (Signed)
 Pt states that she has been having nausea and heaves x 2 days. States that her BP has been elevated. Sees the doctor Wednesday for BP.

## 2024-10-07 NOTE — Discharge Instructions (Signed)
 It was a pleasure taking care of you today.  As discussed, your workup was reassuring.  CT abdomen was negative.  Your urine showed evidence of an infection.  I am sending you home with antibiotics.  Take as prescribed and finish all antibiotics.  I am also sending you home with Zofran  as needed for nausea.  Follow-up with PCP next week for a recheck.  Return to the ER for any worsening symptoms.

## 2024-10-10 LAB — URINE CULTURE: Culture: >=100000 — AB

## 2024-10-11 ENCOUNTER — Telehealth (HOSPITAL_BASED_OUTPATIENT_CLINIC_OR_DEPARTMENT_OTHER): Payer: Self-pay

## 2024-10-11 NOTE — Telephone Encounter (Signed)
 Post ED Visit - Positive Culture Follow-up: Successful Patient Follow-Up  Culture assessed and recommendations reviewed by:  [x]  Elma Fail, Pharm.D. []  Venetia Gully, Pharm.D., BCPS AQ-ID []  Garrel Crews, Pharm.D., BCPS []  Almarie Lunger, Pharm.D., BCPS []  [place], [address].D., BCPS, AAHIVP []  Rosaline Bihari, Pharm.D., BCPS, AAHIVP []  Vernell Meier, PharmD, BCPS []  Latanya Hint, PharmD, BCPS []  Donald Medley, PharmD, BCPS []  Rocky Bold, PharmD  Positive urine culture  []  Patient discharged without antimicrobial prescription and treatment is now indicated []  Organism is resistant to prescribed ED discharge antimicrobial []  Patient with positive blood cultures  Changes discussed with ED provider: Rocky Hamilton, PA-C  Plan: stop Cephalexin , not needed  Contacted patient, date 10/11/24, time 10:40 am   Karen Woods 10/11/2024, 10:44 AM
